# Patient Record
Sex: Male | Born: 1980
Health system: Southern US, Community
[De-identification: ages and names within clinical notes are randomized; demographics above are authoritative.]

## PROBLEM LIST (undated history)

## (undated) DIAGNOSIS — I10 Essential (primary) hypertension: Secondary | ICD-10-CM

## (undated) DIAGNOSIS — N200 Calculus of kidney: Secondary | ICD-10-CM

## (undated) HISTORY — PX: ARTERY REPAIR: SHX559

## (undated) HISTORY — PX: ADENOIDECTOMY: SUR15

---

## 1998-11-28 ENCOUNTER — Emergency Department (HOSPITAL_COMMUNITY): Admission: EM | Admit: 1998-11-28 | Discharge: 1998-11-28 | Payer: Self-pay | Admitting: Emergency Medicine

## 1999-09-10 ENCOUNTER — Emergency Department (HOSPITAL_COMMUNITY): Admission: EM | Admit: 1999-09-10 | Discharge: 1999-09-10 | Payer: Self-pay | Admitting: Emergency Medicine

## 2000-02-12 ENCOUNTER — Emergency Department (HOSPITAL_COMMUNITY): Admission: EM | Admit: 2000-02-12 | Discharge: 2000-02-12 | Payer: Self-pay | Admitting: *Deleted

## 2001-05-03 ENCOUNTER — Emergency Department (HOSPITAL_COMMUNITY): Admission: EM | Admit: 2001-05-03 | Discharge: 2001-05-03 | Payer: Self-pay | Admitting: Emergency Medicine

## 2001-05-07 ENCOUNTER — Encounter: Payer: Self-pay | Admitting: Emergency Medicine

## 2001-05-07 ENCOUNTER — Emergency Department (HOSPITAL_COMMUNITY): Admission: EM | Admit: 2001-05-07 | Discharge: 2001-05-07 | Payer: Self-pay | Admitting: Emergency Medicine

## 2001-06-07 ENCOUNTER — Emergency Department (HOSPITAL_COMMUNITY): Admission: EM | Admit: 2001-06-07 | Discharge: 2001-06-07 | Payer: Self-pay | Admitting: Emergency Medicine

## 2001-07-20 ENCOUNTER — Emergency Department (HOSPITAL_COMMUNITY): Admission: EM | Admit: 2001-07-20 | Discharge: 2001-07-20 | Payer: Self-pay | Admitting: Emergency Medicine

## 2002-08-13 ENCOUNTER — Emergency Department (HOSPITAL_COMMUNITY): Admission: EM | Admit: 2002-08-13 | Discharge: 2002-08-14 | Payer: Self-pay | Admitting: Emergency Medicine

## 2002-08-14 ENCOUNTER — Encounter: Payer: Self-pay | Admitting: Emergency Medicine

## 2002-09-02 ENCOUNTER — Emergency Department (HOSPITAL_COMMUNITY): Admission: EM | Admit: 2002-09-02 | Discharge: 2002-09-02 | Payer: Self-pay | Admitting: Emergency Medicine

## 2002-09-02 ENCOUNTER — Encounter: Payer: Self-pay | Admitting: Emergency Medicine

## 2002-10-17 ENCOUNTER — Emergency Department (HOSPITAL_COMMUNITY): Admission: EM | Admit: 2002-10-17 | Discharge: 2002-10-17 | Payer: Self-pay | Admitting: Emergency Medicine

## 2003-06-16 ENCOUNTER — Emergency Department (HOSPITAL_COMMUNITY): Admission: EM | Admit: 2003-06-16 | Discharge: 2003-06-16 | Payer: Self-pay | Admitting: Emergency Medicine

## 2003-08-07 ENCOUNTER — Emergency Department (HOSPITAL_COMMUNITY): Admission: EM | Admit: 2003-08-07 | Discharge: 2003-08-07 | Payer: Self-pay | Admitting: Emergency Medicine

## 2003-12-11 ENCOUNTER — Emergency Department (HOSPITAL_COMMUNITY): Admission: EM | Admit: 2003-12-11 | Discharge: 2003-12-11 | Payer: Self-pay | Admitting: Emergency Medicine

## 2003-12-18 ENCOUNTER — Emergency Department (HOSPITAL_COMMUNITY): Admission: EM | Admit: 2003-12-18 | Discharge: 2003-12-18 | Payer: Self-pay | Admitting: Emergency Medicine

## 2003-12-21 ENCOUNTER — Emergency Department (HOSPITAL_COMMUNITY): Admission: EM | Admit: 2003-12-21 | Discharge: 2003-12-22 | Payer: Self-pay | Admitting: Emergency Medicine

## 2004-04-29 ENCOUNTER — Emergency Department (HOSPITAL_COMMUNITY): Admission: EM | Admit: 2004-04-29 | Discharge: 2004-04-29 | Payer: Self-pay | Admitting: Emergency Medicine

## 2004-08-23 ENCOUNTER — Emergency Department (HOSPITAL_COMMUNITY): Admission: EM | Admit: 2004-08-23 | Discharge: 2004-08-23 | Payer: Self-pay | Admitting: Emergency Medicine

## 2005-06-23 ENCOUNTER — Emergency Department (HOSPITAL_COMMUNITY): Admission: EM | Admit: 2005-06-23 | Discharge: 2005-06-23 | Payer: Self-pay | Admitting: Emergency Medicine

## 2006-07-03 ENCOUNTER — Emergency Department (HOSPITAL_COMMUNITY): Admission: EM | Admit: 2006-07-03 | Discharge: 2006-07-03 | Payer: Self-pay | Admitting: *Deleted

## 2007-05-17 ENCOUNTER — Emergency Department (HOSPITAL_COMMUNITY): Admission: EM | Admit: 2007-05-17 | Discharge: 2007-05-17 | Payer: Self-pay | Admitting: Emergency Medicine

## 2007-05-18 ENCOUNTER — Emergency Department (HOSPITAL_COMMUNITY): Admission: EM | Admit: 2007-05-18 | Discharge: 2007-05-18 | Payer: Self-pay | Admitting: Emergency Medicine

## 2008-03-22 ENCOUNTER — Emergency Department (HOSPITAL_COMMUNITY): Admission: EM | Admit: 2008-03-22 | Discharge: 2008-03-22 | Payer: Self-pay | Admitting: *Deleted

## 2008-09-04 ENCOUNTER — Emergency Department (HOSPITAL_COMMUNITY): Admission: EM | Admit: 2008-09-04 | Discharge: 2008-09-04 | Payer: Self-pay | Admitting: Emergency Medicine

## 2008-09-07 ENCOUNTER — Emergency Department (HOSPITAL_COMMUNITY): Admission: EM | Admit: 2008-09-07 | Discharge: 2008-09-07 | Payer: Self-pay | Admitting: Emergency Medicine

## 2009-04-12 ENCOUNTER — Emergency Department (HOSPITAL_COMMUNITY): Admission: EM | Admit: 2009-04-12 | Discharge: 2009-04-12 | Payer: Self-pay | Admitting: Emergency Medicine

## 2009-09-10 ENCOUNTER — Emergency Department (HOSPITAL_COMMUNITY): Admission: EM | Admit: 2009-09-10 | Discharge: 2009-09-11 | Payer: Self-pay | Admitting: Emergency Medicine

## 2009-09-21 ENCOUNTER — Emergency Department (HOSPITAL_COMMUNITY): Admission: EM | Admit: 2009-09-21 | Discharge: 2009-09-21 | Payer: Self-pay | Admitting: Emergency Medicine

## 2010-03-27 ENCOUNTER — Emergency Department (HOSPITAL_COMMUNITY): Admission: EM | Admit: 2010-03-27 | Discharge: 2009-07-28 | Payer: Self-pay | Admitting: Emergency Medicine

## 2010-07-21 LAB — COMPREHENSIVE METABOLIC PANEL
BUN: 8 mg/dL (ref 6–23)
CO2: 28 mEq/L (ref 19–32)
Calcium: 9.2 mg/dL (ref 8.4–10.5)
Creatinine, Ser: 1.11 mg/dL (ref 0.4–1.5)
GFR calc Af Amer: >60 mL/min (ref >60)
GFR calc non Af Amer: >60 mL/min (ref >60)
Glucose, Bld: 106 mg/dL — ABNORMAL HIGH (ref 70–99)

## 2010-07-21 LAB — CBC
Hemoglobin: 13.8 g/dL (ref 13.0–17.0)
MCHC: 33.9 g/dL (ref 30.0–36.0)
MCV: 85.5 fL (ref 78.0–100.0)
RBC: 4.76 MIL/uL (ref 4.22–5.81)

## 2010-07-21 LAB — LIPASE, BLOOD: Lipase: 15 U/L (ref 11–59)

## 2010-07-21 LAB — POCT CARDIAC MARKERS
CKMB, poc: 1 ng/mL (ref 1.0–8.0)
Troponin i, poc: <0.05 ng/mL (ref 0.00–0.09)

## 2010-07-21 LAB — DIFFERENTIAL
Lymphocytes Relative: 14 % (ref 12–46)
Lymphs Abs: 0.9 10*3/uL (ref 0.7–4.0)
Neutro Abs: 4.7 10*3/uL (ref 1.7–7.7)
Neutrophils Relative %: 75 % (ref 43–77)

## 2010-09-18 ENCOUNTER — Encounter (HOSPITAL_COMMUNITY): Payer: Self-pay

## 2010-09-18 ENCOUNTER — Emergency Department (HOSPITAL_COMMUNITY)
Admission: EM | Admit: 2010-09-18 | Discharge: 2010-09-18 | Disposition: A | Discharge status: Home / Self Care | Payer: Self-pay | Attending: Emergency Medicine | Admitting: Emergency Medicine

## 2010-09-18 ENCOUNTER — Emergency Department (HOSPITAL_COMMUNITY): Payer: Self-pay

## 2010-09-18 DIAGNOSIS — M549 Dorsalgia, unspecified: Secondary | ICD-10-CM | POA: Insufficient documentation

## 2010-09-18 DIAGNOSIS — N200 Calculus of kidney: Secondary | ICD-10-CM | POA: Insufficient documentation

## 2010-09-18 DIAGNOSIS — R109 Unspecified abdominal pain: Secondary | ICD-10-CM | POA: Insufficient documentation

## 2010-09-18 LAB — URINALYSIS, ROUTINE W REFLEX MICROSCOPIC
Bilirubin Urine: NEGATIVE
Ketones, ur: NEGATIVE mg/dL
Nitrite: NEGATIVE
Urobilinogen, UA: 0.2 mg/dL (ref 0.0–1.0)

## 2010-09-20 ENCOUNTER — Inpatient Hospital Stay (INDEPENDENT_AMBULATORY_CARE_PROVIDER_SITE_OTHER)
Admission: RE | Admit: 2010-09-20 | Discharge: 2010-09-20 | Disposition: A | Discharge status: Home / Self Care | Payer: Self-pay | Source: Ambulatory Visit | Attending: Family Medicine | Admitting: Family Medicine

## 2010-09-20 DIAGNOSIS — R300 Dysuria: Secondary | ICD-10-CM

## 2010-09-20 DIAGNOSIS — R10819 Abdominal tenderness, unspecified site: Secondary | ICD-10-CM

## 2010-09-20 DIAGNOSIS — R319 Hematuria, unspecified: Secondary | ICD-10-CM

## 2010-09-20 LAB — POCT I-STAT, CHEM 8
BUN: 11 mg/dL (ref 6–23)
Creatinine, Ser: 1.3 mg/dL (ref 0.4–1.5)
Glucose, Bld: 99 mg/dL (ref 70–99)
Hemoglobin: 15.3 g/dL (ref 13.0–17.0)
Potassium: 3.7 mEq/L (ref 3.5–5.1)

## 2010-09-20 LAB — CBC
HCT: 40.8 % (ref 39.0–52.0)
Hemoglobin: 14 g/dL (ref 13.0–17.0)
MCH: 28.1 pg (ref 26.0–34.0)
MCHC: 34.3 g/dL (ref 30.0–36.0)
MCV: 81.9 fL (ref 78.0–100.0)
RBC: 4.98 MIL/uL (ref 4.22–5.81)

## 2010-09-20 LAB — POCT URINALYSIS DIP (DEVICE)
Glucose, UA: NEGATIVE mg/dL
Ketones, ur: NEGATIVE mg/dL
Protein, ur: NEGATIVE mg/dL
Specific Gravity, Urine: 1.015 (ref 1.005–1.030)
Urobilinogen, UA: 0.2 mg/dL (ref 0.0–1.0)

## 2010-09-22 LAB — URINE CULTURE
Colony Count: NO GROWTH
Culture: NO GROWTH

## 2011-05-29 ENCOUNTER — Encounter (HOSPITAL_COMMUNITY): Payer: Self-pay | Admitting: *Deleted

## 2011-05-29 ENCOUNTER — Emergency Department (HOSPITAL_COMMUNITY)
Admission: EM | Admit: 2011-05-29 | Discharge: 2011-05-29 | Disposition: A | Discharge status: Home / Self Care | Payer: Self-pay | Attending: Emergency Medicine | Admitting: Emergency Medicine

## 2011-05-29 DIAGNOSIS — F17200 Nicotine dependence, unspecified, uncomplicated: Secondary | ICD-10-CM | POA: Insufficient documentation

## 2011-05-29 DIAGNOSIS — M545 Low back pain, unspecified: Secondary | ICD-10-CM | POA: Insufficient documentation

## 2011-05-29 DIAGNOSIS — R109 Unspecified abdominal pain: Secondary | ICD-10-CM | POA: Insufficient documentation

## 2011-05-29 DIAGNOSIS — I10 Essential (primary) hypertension: Secondary | ICD-10-CM | POA: Insufficient documentation

## 2011-05-29 DIAGNOSIS — J45909 Unspecified asthma, uncomplicated: Secondary | ICD-10-CM | POA: Insufficient documentation

## 2011-05-29 HISTORY — DX: Essential (primary) hypertension: I10

## 2011-05-29 LAB — URINALYSIS, ROUTINE W REFLEX MICROSCOPIC
Leukocytes, UA: NEGATIVE
Nitrite: NEGATIVE
Protein, ur: NEGATIVE mg/dL
Specific Gravity, Urine: 1.016 (ref 1.005–1.030)
Urobilinogen, UA: 0.2 mg/dL (ref 0.0–1.0)

## 2011-05-29 MED ORDER — IBUPROFEN 800 MG PO TABS
800.0000 mg | ORAL_TABLET | Freq: Once | ORAL | Status: AC
  Administered 2011-05-29: 800 mg via ORAL
  Filled 2011-05-29 (×2): qty 1

## 2011-05-29 MED ORDER — CYCLOBENZAPRINE HCL 5 MG PO TABS: 5.0000 mg | ORAL_TABLET | Freq: Three times a day (TID) | ORAL | Status: AC | PRN

## 2011-05-29 MED ORDER — IBUPROFEN 600 MG PO TABS: 600.0000 mg | ORAL_TABLET | Freq: Four times a day (QID) | ORAL | Status: AC | PRN

## 2011-05-29 MED ORDER — OXYCODONE-ACETAMINOPHEN 5-325 MG PO TABS: 1.0000 | ORAL_TABLET | Freq: Four times a day (QID) | ORAL | Status: AC | PRN

## 2011-05-29 NOTE — ED Notes (Signed)
Pt states "seen here about 4 months ago, having back pain on both sides, they told me I was having kidney stones, I was peeing blood the last time, I's had h/a's more often, somewhat pain when I pee"

## 2011-05-29 NOTE — ED Provider Notes (Signed)
History     CSN: [id number]  Arrival date & time 05/29/11  1211   First MD Initiated Contact with Patient 05/29/11 1249      Chief Complaint  Patient presents with  . Flank Pain    (Consider location/radiation/quality/duration/timing/severity/associated sxs/prior treatment) HPI Patient presents with complaint of low back pain which is worse on the left. States pain has been ongoing for the past several weeks but was worse this morning when he woke up. He also notes that he has started a new job recently and has had some changes in his activities. The pain is worse with movement and palpation. He has not seen any blood in his urine or had any dysuria urinary urgency or frequency. He denies fevers or chills. He has no nausea or vomiting. He has had no specific trauma. He states that he was worked up in the past due to concern for kidney stones but in fact he did not have kidney stones. There no other alleviating or modifying factors. There no other associated systemic symptoms. He has no weakness in legs, no retention of urine, no incontinence of bowel or bladder. Symptoms are constant.  Past Medical History  Diagnosis Date  . Hypertension   . Asthma     History reviewed. No pertinent past surgical history.  No family history on file.  History  Substance Use Topics  . Smoking status: Current Everyday Smoker -- 0.5 packs/day  . Smokeless tobacco: Not on file  . Alcohol Use: Yes     rarely      Review of Systems ROS reviewed and otherwise negative except for mentioned in HPI  Allergies  Review of patient's allergies indicates no known allergies.  Home Medications   Current Outpatient Rx  Name Route Sig Dispense Refill  . ALBUTEROL SULFATE HFA 108 (90 BASE) MCG/ACT IN AERS Inhalation Inhale 2 puffs into the lungs every 6 (six) hours as needed. For shortness of breath.    . ALBUTEROL SULFATE (2.5 MG/3ML) 0.083% IN NEBU Nebulization Take 2.5 mg by nebulization every 6 (six)  hours as needed. For shortness of breath.    . CLONIDINE HCL PO Oral Take 1 tablet by mouth daily as needed. For chest pain.    . IBUPROFEN 200 MG PO TABS Oral Take 400 mg by mouth every 8 (eight) hours as needed. For pain.      BP 142/93  Pulse 76  Temp(Src) 99.3 F (37.4 C) (Oral)  Resp 16  Ht 6' 0.5" (1.842 m)  Wt 285 lb (129.275 kg)  BMI 38.12 kg/m2  SpO2 99% Vitals reviewed Physical Exam Physical Examination: General appearance - alert, well appearing, and in no distress Mental status - alert, oriented to person, place, and time Mouth - mucous membranes moist, pharynx normal without lesions Chest - clear to auscultation, no wheezes, rales or rhonchi, symmetric air entry Heart - normal rate, regular rhythm, normal S1, S2, no murmurs, rubs, clicks or gallops Abdomen - soft, nontender, nondistended, no masses or organomegaly Neurological - alert, oriented, normal speech, strength 5/5 in extremities x 4, sensation intact Back- mild parapinal tenderness to palpation- worse on left than right, no midline ttp, no CVA tenderness Musculoskeletal - no joint tenderness, deformity or swelling Extremities - peripheral pulses normal, no pedal edema, no clubbing or cyanosis Skin - normal coloration and turgor, no rashes  ED Course  Procedures (including critical care time)   Labs Reviewed  URINALYSIS, ROUTINE W REFLEX MICROSCOPIC   No results found.  No diagnosis found.    MDM  Patient presenting with low back pain primarily in the left side. His urine does not reveal signs of infection or hematuria. His exam reveals mild paraspinal muscle tenderness on the left. There is no CVA tenderness. Prior CT scan was reviewed and did not show any sign of renal stone but did show some bladder wall thickening which patient is aware had been found and was advised again to have followed up if need be. Patient was given ibuprofen for discomfort. I suspect muscle spasm/musculoskeletal low back  pain. There no signs or symptoms of cauda equina. He was discharged with strict return precautions and is agreeable with this plan.        Ethelda Chick, MD 06/02/11 [phone]

## 2011-05-29 NOTE — ED Notes (Signed)
Pt c/o bilateral lower back pain x 2 weeks, flank pain x 2 months.Today lower back pain worse described as burning, pt reports discomfort when urinating, urgency and the feeling of not emptying completely.

## 2012-02-16 ENCOUNTER — Emergency Department (HOSPITAL_COMMUNITY)
Admission: EM | Admit: 2012-02-16 | Discharge: 2012-02-16 | Disposition: A | Discharge status: Home / Self Care | Payer: Self-pay | Attending: Emergency Medicine | Admitting: Emergency Medicine

## 2012-02-16 ENCOUNTER — Encounter (HOSPITAL_COMMUNITY): Payer: Self-pay | Admitting: *Deleted

## 2012-02-16 DIAGNOSIS — I10 Essential (primary) hypertension: Secondary | ICD-10-CM | POA: Insufficient documentation

## 2012-02-16 DIAGNOSIS — F17200 Nicotine dependence, unspecified, uncomplicated: Secondary | ICD-10-CM | POA: Insufficient documentation

## 2012-02-16 DIAGNOSIS — J45909 Unspecified asthma, uncomplicated: Secondary | ICD-10-CM | POA: Insufficient documentation

## 2012-02-16 DIAGNOSIS — Z79899 Other long term (current) drug therapy: Secondary | ICD-10-CM | POA: Insufficient documentation

## 2012-02-16 DIAGNOSIS — R110 Nausea: Secondary | ICD-10-CM | POA: Insufficient documentation

## 2012-02-16 DIAGNOSIS — M940 Chondrocostal junction syndrome [Tietze]: Secondary | ICD-10-CM | POA: Insufficient documentation

## 2012-02-16 MED ORDER — HYDROCHLOROTHIAZIDE 25 MG PO TABS: 50.0000 mg | ORAL_TABLET | Freq: Every day | ORAL | Status: DC

## 2012-02-16 MED ORDER — HYDROCODONE-ACETAMINOPHEN 5-500 MG PO TABS: 1.0000 | ORAL_TABLET | Freq: Four times a day (QID) | ORAL | Status: DC | PRN

## 2012-02-16 NOTE — Progress Notes (Signed)
While pt in [facility] seen by [facility] community liaison spoke with her Pt offered [facility] services to assist with finding a [place] self pay provider and given health reform information  

## 2012-02-16 NOTE — ED Provider Notes (Signed)
History   This chart was scribed for non-physician practitioner working with Lyanne Co, MD by Greer Ee. This patient was seen in room WTR5/WTR5 and the patient's care was started at 15:31.    CSN: [id number]  Arrival date & time 02/16/12  1325   First MD Initiated Contact with Patient 02/16/12 1453      No chief complaint on file.   (Consider location/radiation/quality/duration/timing/severity/associated sxs/prior treatment) The history is provided by the patient. No language interpreter was used.   Jared Turner is a 31 y.o. male who presents to the Emergency Department complaining of 3 days of constant throbbing upper back pain with gradual onset at work not associated with heavy lifting or any unusual actions or movements.  Pain radiates to chest with deep breathing, but there is no true chest pain present.  Pain is improved at rest and temporarily by Aleve.  Pain worsened by twisting, bending down, and deep breathing.  Pt reports associated dyspnea and nausea.  Pt denies weakness, numbness, tingling, and emesis as associated.  Pt reports car trip to [place] 3 weeks ago but denies further long trips.  Pt has h/o HTN and asthma.  Pt is a current everyday smoker and reports rare alcohol use.   Past Medical History  Diagnosis Date  . Hypertension   . Asthma     History reviewed. No pertinent past surgical history.  History reviewed. No pertinent family history.  History  Substance Use Topics  . Smoking status: Current Every Day Smoker -- 0.5 packs/day  . Smokeless tobacco: Never Used  . Alcohol Use: Yes     rarely      Review of Systems  HENT: Negative for neck pain.   Gastrointestinal: Positive for nausea. Negative for vomiting and abdominal pain.    Allergies  Review of patient's allergies indicates no known allergies.  Home Medications   Current Outpatient Rx  Name Route Sig Dispense Refill  . ALBUTEROL SULFATE HFA 108 (90 BASE) MCG/ACT IN AERS  Inhalation Inhale 2 puffs into the lungs every 6 (six) hours as needed. For shortness of breath.    . CYCLOBENZAPRINE HCL 5 MG PO TABS Oral Take 5 mg by mouth 3 (three) times daily as needed. Muscle spasms    . HYDROCHLOROTHIAZIDE 50 MG PO TABS Oral Take 50 mg by mouth daily.      BP 155/95  Pulse 71  Temp 98.7 F (37.1 C) (Oral)  Resp 18  SpO2 99%  Physical Exam  Nursing note and vitals reviewed. Constitutional: He is oriented to person, place, and time. He appears well-developed and well-nourished. No distress.  HENT:  Head: Normocephalic and atraumatic.  Eyes: EOM are normal.  Neck: Neck supple. No tracheal deviation present.  Cardiovascular: Normal rate, regular rhythm and normal heart sounds.   No murmur heard. Pulmonary/Chest: Effort normal and breath sounds normal. No respiratory distress.  Musculoskeletal: Normal range of motion.       Good ROM of right shoulder with no pain.  Neurological: He is alert and oriented to person, place, and time.       5/5 grip strength.  5/5 upper extremity strength.  Skin: Skin is warm and dry.       No rash over back.  Psychiatric: He has a normal mood and affect. His behavior is normal.    ED Course  Procedures (including critical care time) DIAGNOSTIC STUDIES: Oxygen Saturation is 99% on room air, normal by my interpretation.    COORDINATION OF  CARE: 15:38- Patient informed of clinical course, understands medical decision-making process, and agrees with plan.    Labs Reviewed - No data to display No results found.   1. Costochondritis       MDM  Exam consistent with costochondritis. No focal neuro deficits. Full shoulder ROM. Advised ibuprofen/tylenol/aleve. Rx vicodin for break thru pain for a couple days. Return precautions discussed.  I personally performed the services described in this documentation, which was scribed in my presence. The recorded information has been reviewed and considered. Lyanne Co,  MD         Trevor Mace, PA-C 02/16/12 [phone]

## 2012-02-16 NOTE — ED Notes (Signed)
Pt states saturday was at home and stood up and started having severe R shoulder/back pain, pt states he almost hit the ground, over the past couple days has worsened, now the pain is across upper back area, pt denies any injury.

## 2012-02-17 NOTE — ED Provider Notes (Addendum)
I saw and evaluated the patient, reviewed the resident's note and I agree with the findings and plan.   Lyanne Co, MD 02/17/12 0005  Lyanne Co, MD 02/17/12 [phone]

## 2013-02-26 ENCOUNTER — Encounter (HOSPITAL_COMMUNITY): Payer: Self-pay | Admitting: Emergency Medicine

## 2013-02-26 ENCOUNTER — Emergency Department (HOSPITAL_COMMUNITY)
Admission: EM | Admit: 2013-02-26 | Discharge: 2013-02-26 | Disposition: A | Discharge status: Home / Self Care | Payer: Self-pay | Attending: Emergency Medicine | Admitting: Emergency Medicine

## 2013-02-26 DIAGNOSIS — K089 Disorder of teeth and supporting structures, unspecified: Secondary | ICD-10-CM | POA: Insufficient documentation

## 2013-02-26 DIAGNOSIS — H9209 Otalgia, unspecified ear: Secondary | ICD-10-CM | POA: Insufficient documentation

## 2013-02-26 DIAGNOSIS — G8929 Other chronic pain: Secondary | ICD-10-CM | POA: Insufficient documentation

## 2013-02-26 DIAGNOSIS — J45901 Unspecified asthma with (acute) exacerbation: Secondary | ICD-10-CM | POA: Insufficient documentation

## 2013-02-26 DIAGNOSIS — R079 Chest pain, unspecified: Secondary | ICD-10-CM | POA: Insufficient documentation

## 2013-02-26 DIAGNOSIS — F17200 Nicotine dependence, unspecified, uncomplicated: Secondary | ICD-10-CM | POA: Insufficient documentation

## 2013-02-26 DIAGNOSIS — I10 Essential (primary) hypertension: Secondary | ICD-10-CM | POA: Insufficient documentation

## 2013-02-26 DIAGNOSIS — J040 Acute laryngitis: Secondary | ICD-10-CM | POA: Insufficient documentation

## 2013-02-26 DIAGNOSIS — Z79899 Other long term (current) drug therapy: Secondary | ICD-10-CM | POA: Insufficient documentation

## 2013-02-26 LAB — RAPID STREP SCREEN (MED CTR MEBANE ONLY): Streptococcus, Group A Screen (Direct): NEGATIVE

## 2013-02-26 MED ORDER — BENZONATATE 100 MG PO CAPS
100.0000 mg | ORAL_CAPSULE | Freq: Once | ORAL | Status: AC
  Administered 2013-02-26: 100 mg via ORAL
  Filled 2013-02-26: qty 1

## 2013-02-26 MED ORDER — AMOXICILLIN 500 MG PO CAPS: 500.0000 mg | ORAL_CAPSULE | Freq: Three times a day (TID) | ORAL | Status: DC

## 2013-02-26 MED ORDER — BENZONATATE 100 MG PO CAPS: 100.0000 mg | ORAL_CAPSULE | Freq: Three times a day (TID) | ORAL | Status: DC

## 2013-02-26 MED ORDER — HYDROCODONE-ACETAMINOPHEN 5-325 MG PO TABS: 1.0000 | ORAL_TABLET | ORAL | Status: DC | PRN

## 2013-02-26 NOTE — ED Notes (Signed)
Onset yesterday throat started tingling then started with cough, runny nose, nasal congestion, hoarse voice.

## 2013-02-26 NOTE — ED Notes (Signed)
Pt. reports sore throat with occasional productive cough onset yesterday evening .

## 2013-02-26 NOTE — ED Provider Notes (Signed)
CSN: [id number]     Arrival date & time 02/26/13  1957 History  This chart was scribed for non-physician practitioner, Fayrene Helper, PA-C,working with No att. providers found, by Karle Plumber, ED Scribe.  This patient was seen in room TR08C/TR08C and the patient's care was started at 8:39 PM.  Chief Complaint  Patient presents with  . Sore Throat   The history is provided by the patient. No language interpreter was used.   HPI Comments:  Jared Turner is a 32 y.o. male with h/o bronchitis who presents to the Emergency Department complaining of constant severe sore throat onset yesterday. Pt reports an associated intermittent productive cough, hoarseness, ear fullness, mild rhinorrhea and chest tightness. Pt states he has used his Albuterol inhaler with mild relief. He denies any recent travel. He denies fever, chills, nausea, vomiting, diarrhea, body aches, or abdominal pain. He states he smokes approximately 1/2 PPD.   Pt also report having recurrent abscess involving his L upper gum line which he can usually taste it and sometimes have to puncture it to drain pus.  He request for a dental referral.  Sts sxs has been ongoing for more than a year.   Past Medical History  Diagnosis Date  . Hypertension   . Asthma    History reviewed. No pertinent past surgical history. No family history on file. History  Substance Use Topics  . Smoking status: Current Every Day Smoker -- 0.50 packs/day  . Smokeless tobacco: Never Used  . Alcohol Use: Yes     Comment: rarely    Review of Systems  Constitutional: Negative for fever and chills.  HENT: Positive for ear pain (secondary to sore throat), rhinorrhea and sore throat. Negative for sneezing.   Respiratory: Positive for cough and shortness of breath.   Cardiovascular: Positive for chest pain (secondary to cough).  Gastrointestinal: Negative for nausea, vomiting, abdominal pain and diarrhea.    Allergies  Review of patient's allergies  indicates no known allergies.  Home Medications   Current Outpatient Rx  Name  Route  Sig  Dispense  Refill  . albuterol (PROVENTIL HFA;VENTOLIN HFA) 108 (90 BASE) MCG/ACT inhaler   Inhalation   Inhale 2 puffs into the lungs every 6 (six) hours as needed. For shortness of breath.         . cyclobenzaprine (FLEXERIL) 5 MG tablet   Oral   Take 5 mg by mouth 3 (three) times daily as needed. Muscle spasms         . hydrochlorothiazide (HYDRODIURIL) 25 MG tablet   Oral   Take 2 tablets (50 mg total) by mouth daily.   30 tablet   0   . hydrochlorothiazide (HYDRODIURIL) 50 MG tablet   Oral   Take 50 mg by mouth daily.         Marland Kitchen HYDROcodone-acetaminophen (VICODIN) 5-500 MG per tablet   Oral   Take 1-2 tablets by mouth every 6 (six) hours as needed for pain.   8 tablet   0    Triage Vitals: BP 165/108  Pulse 93  Temp(Src) 99.3 F (37.4 C) (Oral)  Resp 18  Wt 306 lb 7 oz (138.999 kg)  SpO2 97% Physical Exam  Nursing note and vitals reviewed. Constitutional: He is oriented to person, place, and time. He appears well-developed and well-nourished. No distress.  HENT:  Head: Normocephalic and atraumatic.  Right Ear: External ear normal.  Left Ear: External ear normal.  Mouth/Throat: Oropharynx is clear and moist. No oropharyngeal  exudate.  Mild rhinorrhea. No tonsillar enlargement or exudate. Uvula midline. Throat non-erythematous.   Mouth: L upper gumline tender to palpation, no obvious abscess noted, mild dental decay to 1st premolar.    Eyes: Conjunctivae are normal. No scleral icterus.  Neck: Neck supple.  Cardiovascular: Normal rate and intact distal pulses.   Pulmonary/Chest: Effort normal. No stridor. No respiratory distress.  Abdominal: Soft. Normal appearance. He exhibits no distension and no mass. There is no tenderness. There is no rebound and no guarding.  No evidence of splenomegaly.  Lymphadenopathy:    He has no cervical adenopathy.  Neurological: He is  alert and oriented to person, place, and time.  Skin: Skin is warm and dry. No rash noted.  Psychiatric: He has a normal mood and affect. His behavior is normal.    ED Course  Procedures (including critical care time) DIAGNOSTIC STUDIES: Oxygen Saturation is 97% on RA, normal by my interpretation.   COORDINATION OF CARE: 8:45 PM- Will obtain a rapid strep test. Pt verbalizes understanding and agrees to plan.  9:27 PM Strep test negative.  Since pt has recurrent dental abscess, will d/c with abx, referral to dentist, and will also treats laryngitis sxs.  ENT referral as needed.    Medications  benzonatate (TESSALON) capsule 100 mg (not administered)   Labs Review Labs Reviewed  RAPID STREP SCREEN   Imaging Review No results found.  EKG Interpretation   None       MDM   1. Chronic dental pain   2. Laryngitis    BP 163/109  Pulse 76  Temp(Src) 97.4 F (36.3 C) (Oral)  Resp 18  Wt 306 lb 7 oz (138.999 kg)  SpO2 97%   DIAGNOSTIC STUDIES: Oxygen Saturation is 97% on room air, normal by my interpretation.   COORDINATION OF CARE:  I personally performed the services described in this documentation, which was scribed in my presence. The recorded information has been reviewed and is accurate.    Fayrene Helper, PA-C 03/01/13 1502

## 2013-02-28 LAB — CULTURE, GROUP A STREP

## 2013-03-01 NOTE — ED Provider Notes (Signed)
Medical screening examination/treatment/procedure(s) were performed by non-physician practitioner and as supervising physician I was immediately available for consultation/collaboration.     Jazmine Longshore, MD 03/01/13 1556 

## 2015-05-19 ENCOUNTER — Encounter (HOSPITAL_COMMUNITY): Payer: Self-pay | Admitting: Vascular Surgery

## 2015-05-19 ENCOUNTER — Emergency Department (HOSPITAL_COMMUNITY)
Admission: EM | Admit: 2015-05-19 | Discharge: 2015-05-19 | Disposition: A | Discharge status: Home / Self Care | Payer: Self-pay | Attending: Emergency Medicine | Admitting: Emergency Medicine

## 2015-05-19 DIAGNOSIS — Z79899 Other long term (current) drug therapy: Secondary | ICD-10-CM | POA: Insufficient documentation

## 2015-05-19 DIAGNOSIS — Z87442 Personal history of urinary calculi: Secondary | ICD-10-CM | POA: Insufficient documentation

## 2015-05-19 DIAGNOSIS — Z792 Long term (current) use of antibiotics: Secondary | ICD-10-CM | POA: Insufficient documentation

## 2015-05-19 DIAGNOSIS — I10 Essential (primary) hypertension: Secondary | ICD-10-CM | POA: Insufficient documentation

## 2015-05-19 DIAGNOSIS — J45909 Unspecified asthma, uncomplicated: Secondary | ICD-10-CM | POA: Insufficient documentation

## 2015-05-19 DIAGNOSIS — H6691 Otitis media, unspecified, right ear: Secondary | ICD-10-CM | POA: Insufficient documentation

## 2015-05-19 DIAGNOSIS — F17200 Nicotine dependence, unspecified, uncomplicated: Secondary | ICD-10-CM | POA: Insufficient documentation

## 2015-05-19 DIAGNOSIS — Z7982 Long term (current) use of aspirin: Secondary | ICD-10-CM | POA: Insufficient documentation

## 2015-05-19 DIAGNOSIS — R590 Localized enlarged lymph nodes: Secondary | ICD-10-CM | POA: Insufficient documentation

## 2015-05-19 HISTORY — DX: Calculus of kidney: N20.0

## 2015-05-19 MED ORDER — AMOXICILLIN 500 MG PO CAPS: 500.0000 mg | ORAL_CAPSULE | Freq: Three times a day (TID) | ORAL | Status: DC

## 2015-05-19 MED ORDER — IBUPROFEN 600 MG PO TABS: 600.0000 mg | ORAL_TABLET | Freq: Four times a day (QID) | ORAL | Status: DC | PRN

## 2015-05-19 NOTE — ED Notes (Signed)
Pt reports to the ED for eval of right ear pain and drainage. Pt reports he has been  Having problems with his ear x 1 year. Pt reports he feels it draining down into his throat and occasionally it drains externally. Denies any fevers, chills, or N/V. Pt also reports he shaved his face and he is broken out on his face and it is itchy and painful x 2 months. Pt A&OX4, resp e/u, and skin warm and dry.

## 2015-05-19 NOTE — Discharge Instructions (Signed)
Ibuprofen as prescribed as needed for pain. Amoxil for infection until all gone. Follow up with primary care doctor for recheck. Return if worsening.    Otitis Media, Adult Otitis media is redness, soreness, and inflammation of the middle ear. Otitis media may be caused by allergies or, most commonly, by infection. Often it occurs as a complication of the common cold. SIGNS AND SYMPTOMS Symptoms of otitis media may include:  Earache.  Fever.  Ringing in your ear.  Headache.  Leakage of fluid from the ear. DIAGNOSIS To diagnose otitis media, your health care provider will examine your ear with an otoscope. This is an instrument that allows your health care provider to see into your ear in order to examine your eardrum. Your health care provider also will ask you questions about your symptoms. TREATMENT  Typically, otitis media resolves on its own within 3-5 days. Your health care provider may prescribe medicine to ease your symptoms of pain. If otitis media does not resolve within 5 days or is recurrent, your health care provider may prescribe antibiotic medicines if he or she suspects that a bacterial infection is the cause. HOME CARE INSTRUCTIONS   If you were prescribed an antibiotic medicine, finish it all even if you start to feel better.  Take medicines only as directed by your health care provider.  Keep all follow-up visits as directed by your health care provider. SEEK MEDICAL CARE IF:  You have otitis media only in one ear, or bleeding from your nose, or both.  You notice a lump on your neck.  You are not getting better in 3-5 days.  You feel worse instead of better. SEEK IMMEDIATE MEDICAL CARE IF:   You have pain that is not controlled with medicine.  You have swelling, redness, or pain around your ear or stiffness in your neck.  You notice that part of your face is paralyzed.  You notice that the bone behind your ear (mastoid) is tender when you touch it. MAKE  SURE YOU:   Understand these instructions.  Will watch your condition.  Will get help right away if you are not doing well or get worse.   This information is not intended to replace advice given to you by your health care provider. Make sure you discuss any questions you have with your health care provider.   Document Released: 01/10/2004 Document Revised: 04/27/2014 Document Reviewed: 11/01/2012 Elsevier Interactive Patient Education [employer]

## 2015-05-19 NOTE — ED Provider Notes (Signed)
CSN: [id number]     Arrival date & time 05/19/15  1637 History   First MD Initiated Contact with Patient 05/19/15 1652     Chief Complaint  Patient presents with  . Ear Drainage     (Consider location/radiation/quality/duration/timing/severity/associated sxs/prior Treatment) HPI  Jared Turner is a 35 y.o. male with hx of hypertension, presents to emergency department complaining of right ear pain. Patient states that the ear started hurting yesterday. He reports prior ear infections that have been much worse, and states "I just wanted to get to that point." He denies any changes in hearing. Denies any injuries. He denies any drainage from the ear. He also reports a swollen bump under his chin. He states it is tender to the touch. States also started yesterday. He denies any fever or chills. No other URI symptoms. Did not try to take anything for this pain. No other complaints.   Past Medical History  Diagnosis Date  . Hypertension   . Asthma   . Nephrolithiasis    Past Surgical History  Procedure Laterality Date  . Artery repair Right arm   No family history on file. Social History  Substance Use Topics  . Smoking status: Current Every Day Smoker -- 0.50 packs/day  . Smokeless tobacco: Never Used  . Alcohol Use: Yes     Comment: rarely    Review of Systems  Constitutional: Negative for fever and chills.  HENT: Positive for ear pain. Negative for congestion and sore throat.   Respiratory: Negative for cough, chest tightness and shortness of breath.   Cardiovascular: Negative for chest pain, palpitations and leg swelling.  Musculoskeletal: Negative for myalgias, arthralgias, neck pain and neck stiffness.  Skin: Negative for rash.  Allergic/Immunologic: Negative for immunocompromised state.  Neurological: Positive for headaches. Negative for dizziness, weakness, light-headedness and numbness.  All other systems reviewed and are negative.     Allergies  Milk-related  compounds  Home Medications   Prior to Admission medications   Medication Sig Start Date End Date Taking? Authorizing Provider  albuterol (PROVENTIL HFA;VENTOLIN HFA) 108 (90 BASE) MCG/ACT inhaler Inhale 2 puffs into the lungs 2 (two) times daily as needed for wheezing or shortness of breath. For shortness of breath.    Historical Provider, MD  amoxicillin (AMOXIL) 500 MG capsule Take 1 capsule (500 mg total) by mouth 3 (three) times daily. 02/26/13   Fayrene Helper, PA-C  Ascorbic Acid (VITAMIN C PO) Take 1 tablet by mouth daily.    Historical Provider, MD  aspirin-acetaminophen-caffeine (EXCEDRIN MIGRAINE) [phone] MG per tablet Take 1 tablet by mouth 2 (two) times daily as needed (pain).    Historical Provider, MD  b complex vitamins tablet Take 1 tablet by mouth daily.    Historical Provider, MD  benzonatate (TESSALON) 100 MG capsule Take 1 capsule (100 mg total) by mouth every 8 (eight) hours. 02/26/13   Fayrene Helper, PA-C  Cyanocobalamin (VITAMIN B-12 PO) Take 1 tablet by mouth daily.    Historical Provider, MD  hydrochlorothiazide (HYDRODIURIL) 50 MG tablet Take 50 mg by mouth daily.    Historical Provider, MD  HYDROcodone-acetaminophen (NORCO/VICODIN) 5-325 MG per tablet Take 1 tablet by mouth every 4 (four) hours as needed. 02/26/13   Fayrene Helper, PA-C   BP 163/97 mmHg  Pulse 81  Temp(Src) 97.9 F (36.6 C) (Oral)  Resp 18  SpO2 100% Physical Exam  Constitutional: He appears well-developed and well-nourished. No distress.  HENT:  Head: Normocephalic and atraumatic.  Normal right ear  and ear canal with no exudate or swelling. Right TM is erythematous and bulging. About 1 x 1 cm, round, firm nodule palpated in the submental space. No swelling under the tongue. No trismus.  Eyes: Conjunctivae are normal.  Neck: Neck supple.  Cardiovascular: Normal rate, regular rhythm and normal heart sounds.   Pulmonary/Chest: Effort normal. No respiratory distress. He has no wheezes. He has no rales.   Musculoskeletal: He exhibits no edema.  Neurological: He is alert.  Skin: Skin is warm and dry.  Nursing note and vitals reviewed.   ED Course  Procedures (including critical care time) Labs Review Labs Reviewed - No data to display  Imaging Review No results found. I have personally reviewed and evaluated these images and lab results as part of my medical decision-making.   EKG Interpretation None      MDM   Final diagnoses:  Recurrent acute otitis media of right ear, unspecified otitis media type  Enlarged submental lymph node   Patient's exam is consistent with right otitis media. Will start on amoxicillin. Patient also has a firm swelling to the submental space, most likely lymphadenopathy versus salivary gland swelling. Instructed to try sour candies, anti-inflammatories. Keep an eye on the swelling. If not improving after ear infection has resolved, make sure to follow-up for recheck. Return precautions discussed.  Filed Vitals:   05/19/15 1642  BP: 163/97  Pulse: 81  Temp: 97.9 F (36.6 C)  TempSrc: Oral  Resp: 18  SpO2: 100%       Jaynie Crumble, PA-C 05/19/15 2206  Gerhard Munch, MD 05/19/15 2327

## 2018-05-21 ENCOUNTER — Emergency Department (HOSPITAL_COMMUNITY)
Admission: EM | Admit: 2018-05-21 | Discharge: 2018-05-21 | Disposition: A | Discharge status: Home / Self Care | Payer: Self-pay | Attending: Emergency Medicine | Admitting: Emergency Medicine

## 2018-05-21 ENCOUNTER — Encounter (HOSPITAL_COMMUNITY): Payer: Self-pay | Admitting: Emergency Medicine

## 2018-05-21 ENCOUNTER — Emergency Department (HOSPITAL_COMMUNITY): Payer: Self-pay

## 2018-05-21 ENCOUNTER — Other Ambulatory Visit: Payer: Self-pay

## 2018-05-21 DIAGNOSIS — I10 Essential (primary) hypertension: Secondary | ICD-10-CM | POA: Insufficient documentation

## 2018-05-21 DIAGNOSIS — Z79899 Other long term (current) drug therapy: Secondary | ICD-10-CM | POA: Insufficient documentation

## 2018-05-21 DIAGNOSIS — N200 Calculus of kidney: Secondary | ICD-10-CM | POA: Insufficient documentation

## 2018-05-21 DIAGNOSIS — J45909 Unspecified asthma, uncomplicated: Secondary | ICD-10-CM | POA: Insufficient documentation

## 2018-05-21 DIAGNOSIS — F17200 Nicotine dependence, unspecified, uncomplicated: Secondary | ICD-10-CM | POA: Insufficient documentation

## 2018-05-21 LAB — COMPREHENSIVE METABOLIC PANEL
ALT: 25 U/L (ref 0–44)
AST: 25 U/L (ref 15–41)
Albumin: 5.1 g/dL — ABNORMAL HIGH (ref 3.5–5.0)
Alkaline Phosphatase: 72 U/L (ref 38–126)
Anion gap: 9 (ref 5–15)
BUN: 20 mg/dL (ref 6–20)
CALCIUM: 9.4 mg/dL (ref 8.9–10.3)
CO2: 27 mmol/L (ref 22–32)
CREATININE: 1.54 mg/dL — AB (ref 0.61–1.24)
Chloride: 102 mmol/L (ref 98–111)
GFR calc Af Amer: >60 mL/min (ref >60)
GFR calc non Af Amer: 56 mL/min — ABNORMAL LOW (ref >60)
Glucose, Bld: 126 mg/dL — ABNORMAL HIGH (ref 70–99)
Potassium: 3.7 mmol/L (ref 3.5–5.1)
Sodium: 138 mmol/L (ref 135–145)
Total Bilirubin: 0.6 mg/dL (ref 0.3–1.2)
Total Protein: 8.5 g/dL — ABNORMAL HIGH (ref 6.5–8.1)

## 2018-05-21 LAB — URINALYSIS, ROUTINE W REFLEX MICROSCOPIC
Bilirubin Urine: NEGATIVE
Glucose, UA: NEGATIVE mg/dL
HGB URINE DIPSTICK: NEGATIVE
KETONES UR: NEGATIVE mg/dL
Leukocytes, UA: NEGATIVE
Nitrite: NEGATIVE
PH: 7 (ref 5.0–8.0)
PROTEIN: NEGATIVE mg/dL
Specific Gravity, Urine: 1.011 (ref 1.005–1.030)

## 2018-05-21 LAB — CBC
HCT: 45.1 % (ref 39.0–52.0)
Hemoglobin: 14.6 g/dL (ref 13.0–17.0)
MCH: 28.4 pg (ref 26.0–34.0)
MCHC: 32.4 g/dL (ref 30.0–36.0)
MCV: 87.7 fL (ref 80.0–100.0)
Platelets: 289 10*3/uL (ref 150–400)
RBC: 5.14 MIL/uL (ref 4.22–5.81)
RDW: 14.5 % (ref 11.5–15.5)
WBC: 10.1 10*3/uL (ref 4.0–10.5)
nRBC: 0 % (ref 0.0–0.2)

## 2018-05-21 LAB — LIPASE, BLOOD: LIPASE: 37 U/L (ref 11–51)

## 2018-05-21 MED ORDER — ONDANSETRON HCL 4 MG PO TABS: 4.0000 mg | ORAL_TABLET | Freq: Three times a day (TID) | ORAL | 0 refills | Status: DC | PRN

## 2018-05-21 MED ORDER — MORPHINE SULFATE (PF) 4 MG/ML IV SOLN
4.0000 mg | Freq: Once | INTRAVENOUS | Status: AC
  Administered 2018-05-21: 4 mg via INTRAVENOUS
  Filled 2018-05-21: qty 1

## 2018-05-21 MED ORDER — SODIUM CHLORIDE 0.9 % IV BOLUS
1000.0000 mL | Freq: Once | INTRAVENOUS | Status: AC
  Administered 2018-05-21: 1000 mL via INTRAVENOUS

## 2018-05-21 MED ORDER — ONDANSETRON HCL 4 MG/2ML IJ SOLN
4.0000 mg | Freq: Once | INTRAMUSCULAR | Status: AC
  Administered 2018-05-21: 4 mg via INTRAVENOUS
  Filled 2018-05-21: qty 2

## 2018-05-21 MED ORDER — OXYCODONE-ACETAMINOPHEN 5-325 MG PO TABS: 1.0000 | ORAL_TABLET | ORAL | 0 refills | Status: AC | PRN

## 2018-05-21 MED ORDER — SODIUM CHLORIDE 0.9% FLUSH
3.0000 mL | Freq: Once | INTRAVENOUS | Status: AC
  Administered 2018-05-21: 3 mL via INTRAVENOUS

## 2018-05-21 MED ORDER — KETOROLAC TROMETHAMINE 30 MG/ML IJ SOLN
30.0000 mg | Freq: Once | INTRAMUSCULAR | Status: AC
  Administered 2018-05-21: 30 mg via INTRAVENOUS
  Filled 2018-05-21: qty 1

## 2018-05-21 MED ORDER — TAMSULOSIN HCL 0.4 MG PO CAPS: 0.4000 mg | ORAL_CAPSULE | Freq: Every day | ORAL | 0 refills | Status: DC

## 2018-05-21 NOTE — ED Notes (Signed)
Urinal given--encouraged pt to give urine sample.

## 2018-05-21 NOTE — Discharge Instructions (Signed)
Mr. Kyzer, Krotz were treated in the emergency department for a kidney stone. I have prescribed pain medication, nausea medication, and a medicine to help you urinate which should help the kidney stone pass.  I have given you contact information for a primary care doctor I'd like you to establish care with to follow up on your blood pressure. I suspect you may need some additional medications to treat this.  Please make an appointment within the next 1-2 weeks.

## 2018-05-21 NOTE — ED Triage Notes (Signed)
Patient c/o right flank pain radiating to abdomen with vomiting since this morning. Denies diarrhea. Hx kidney stones. Denies urinary sx.

## 2018-05-21 NOTE — ED Provider Notes (Signed)
[facility]-EMERGENCY DEPT Provider Note   CSN: [id number] Arrival date & time: 05/21/18  1323     History   Chief Complaint Chief Complaint  Patient presents with  . Flank Pain  . Abdominal Pain  . Hypertension    HPI Jared Turner is a 38 y.o. male presented with acute onset right flank pain radiating into this abdomen that began this morning. Pain is described as a constant deep ache and rated 10/10. Also complains diaphoresis and several episodes of non-bloody, non-bilious vomiting since symptom onset. He has a history of nephrolithiasis in the past, last one being almost 4 years ago. No fevers, chills, diarrhea, hematuria, dysuria, or sick contacts.     Past Medical History:  Diagnosis Date  . Asthma   . Hypertension   . Nephrolithiasis     There are no active problems to display for this patient.   Past Surgical History:  Procedure Laterality Date  . ARTERY REPAIR Right arm        Home Medications    Prior to Admission medications   Medication Sig Start Date End Date Taking? Authorizing Provider  albuterol (PROVENTIL HFA;VENTOLIN HFA) 108 (90 BASE) MCG/ACT inhaler Inhale 2 puffs into the lungs 2 (two) times daily as needed for wheezing or shortness of breath. For shortness of breath.    [provider]  amoxicillin (AMOXIL) 500 MG capsule Take 1 capsule (500 mg total) by mouth 3 (three) times daily. 02/26/13   Fayrene Helperran, [place], PA-C  amoxicillin (AMOXIL) 500 MG capsule Take 1 capsule (500 mg total) by mouth 3 (three) times daily. 05/19/15   Kirichenko, Lemont Fillersatyana, PA-C  Ascorbic Acid (VITAMIN C PO) Take 1 tablet by mouth daily.    [provider]  aspirin-acetaminophen-caffeine (EXCEDRIN MIGRAINE) [phone] MG per tablet Take 1 tablet by mouth 2 (two) times daily as needed (pain).    [provider]  b complex vitamins tablet Take 1 tablet by mouth daily.    [provider]  benzonatate (TESSALON) 100 MG  capsule Take 1 capsule (100 mg total) by mouth every 8 (eight) hours. 02/26/13   Fayrene Helperran, [place]ie, PA-C  Cyanocobalamin (VITAMIN B-12 PO) Take 1 tablet by mouth daily.    [provider]  hydrochlorothiazide (HYDRODIURIL) 50 MG tablet Take 50 mg by mouth daily.    [provider]  HYDROcodone-acetaminophen (NORCO/VICODIN) 5-325 MG per tablet Take 1 tablet by mouth every 4 (four) hours as needed. 02/26/13   Fayrene Helperran, [place], PA-C  ibuprofen (ADVIL,MOTRIN) 600 MG tablet Take 1 tablet (600 mg total) by mouth every 6 (six) hours as needed. 05/19/15   Kirichenko, Tatyana, PA-C  ondansetron (ZOFRAN) 4 MG tablet Take 1 tablet (4 mg total) by mouth every 8 (eight) hours as needed for nausea or vomiting. 05/21/18   Mikalyn Hermida D, DO  oxyCODONE-acetaminophen (PERCOCET) 5-325 MG tablet Take 1 tablet by mouth every 4 (four) hours as needed for up to 5 days for severe pain. 05/21/18 05/26/18  Lenward ChancellorBloomfield, Cleven Jansma D, DO  tamsulosin (FLOMAX) 0.4 MG CAPS capsule Take 1 capsule (0.4 mg total) by mouth daily after breakfast. 05/21/18   Lenward ChancellorBloomfield, Salsabeel Gorelick D, DO    Family History No family history on file.  Social History Social History   Tobacco Use  . Smoking status: Current Every Day Smoker    Packs/day: 0.50  . Smokeless tobacco: Never Used  Substance Use Topics  . Alcohol use: Yes    Comment: rarely  . Drug use: No  Frequency: 3.0 times per week     Allergies   Milk-related compounds and Tylenol [acetaminophen]   Review of Systems Review of Systems  All other systems reviewed and are negative.    Physical Exam Updated Vital Signs BP (!) 171/87 (BP Location: Right Arm)   Pulse 71   Temp 97.8 F (36.6 C) (Oral)   Resp 18   Ht 6' (1.829 m)   Wt 129.3 kg   SpO2 100%   BMI 38.65 kg/m   Physical Exam Constitutional:      General: He is in acute distress.     Appearance: He is diaphoretic.  Cardiovascular:     Rate and Rhythm: Normal rate and regular rhythm.  Pulmonary:      Effort: Pulmonary effort is normal.     Breath sounds: Normal breath sounds.  Abdominal:     General: Bowel sounds are normal.     Palpations: Abdomen is soft.     Tenderness: There is generalized abdominal tenderness.  Musculoskeletal:     Comments: Generalized right flank tenderness  Neurological:     General: No focal deficit present.     Mental Status: He is alert.      ED Treatments / Results  Labs (all labs ordered are listed, but only abnormal results are displayed) Labs Reviewed  COMPREHENSIVE METABOLIC PANEL - Abnormal; Notable for the following components:      Result Value   Glucose, Bld 126 (*)    Creatinine, Ser 1.54 (*)    Total Protein 8.5 (*)    Albumin 5.1 (*)    GFR calc non Af Amer 56 (*)    All other components within normal limits  LIPASE, BLOOD  CBC  URINALYSIS, ROUTINE W REFLEX MICROSCOPIC    EKG None  Radiology Ct Renal Stone Study  Result Date: 05/21/2018 CLINICAL DATA:  Acute right flank pain. EXAM: CT ABDOMEN AND PELVIS WITHOUT CONTRAST TECHNIQUE: Multidetector CT imaging of the abdomen and pelvis was performed following the standard protocol without IV contrast. COMPARISON:  CT scan of Sep 18, 2010. FINDINGS: Lower chest: No acute abnormality. Hepatobiliary: No focal liver abnormality is seen. No gallstones, gallbladder wall thickening, or biliary dilatation. Pancreas: Unremarkable. No pancreatic ductal dilatation or surrounding inflammatory changes. Spleen: Normal in size without focal abnormality. Adrenals/Urinary Tract: Adrenal glands appear normal. Mild right hydroureteronephrosis with perinephric stranding is noted secondary to 4 mm calculus in distal right ureter. Left kidney and ureter are unremarkable. Urinary bladder is unremarkable. Stomach/Bowel: Stomach is within normal limits. Appendix appears normal. No evidence of bowel wall thickening, distention, or inflammatory changes. Vascular/Lymphatic: No significant vascular findings are present.  No enlarged abdominal or pelvic lymph nodes. Reproductive: Prostate is unremarkable. Other: No abdominal wall hernia or abnormality. No abdominopelvic ascites. Musculoskeletal: No acute or significant osseous findings. IMPRESSION: Mild right hydroureteronephrosis with perinephric stranding is noted secondary to 4 mm calculus in distal right ureter. Electronically Signed   By: Lupita Raider, M.D.   On: 05/21/2018 14:48    Procedures Procedures (including critical care time)  Medications Ordered in ED Medications  sodium chloride flush (NS) 0.9 % injection 3 mL (3 mLs Intravenous Given 05/21/18 1356)  ondansetron (ZOFRAN) injection 4 mg (4 mg Intravenous Given 05/21/18 1354)  morphine 4 MG/ML injection 4 mg (4 mg Intravenous Given 05/21/18 1404)  sodium chloride 0.9 % bolus 1,000 mL (1,000 mLs Intravenous New Bag/Given 05/21/18 1404)     Initial Impression / Assessment and Plan / ED Course  I have reviewed the triage vital signs and the nursing notes.  Pertinent labs & imaging results that were available during my care of the patient were reviewed by me and considered in my medical decision making (see chart for details).   Clinical Course as of May 21 1506  Sat May 21, 2018  1450 Creatinine(!): 1.54 [CB]    Clinical Course User Index [CB] Bridget Hartshorn, DO  38 y/o gentleman with h/o HTN and nephrolithiasis presents with acute onset flank pain radiating into the abdomen with associated nausea and vomiting. On exam he is diaphoretic and constantly moving to try and get comfortable with right flank and abdominal pain. Most likely etiologies are renal colic versus gastroenteritis. Will obtain CT stone study, U/A, CMP, and lipase.  Will treat supportively with IVFs, anti-emetics and analgesics.    Final Clinical Impressions(s) / ED Diagnoses   Final diagnoses:  Kidney stone  Essential hypertension  CT confirmed 4 mm calculus in distal right ureter. Patient has improved clinically. Will  provide short course of Percocet, Zofran and Flomax until he passes the stone at home.  His renal function has been trending up for the last several years which is likely in the setting of longstanding uncontrolled HTN. Have made referral for him to establish care at [facility], as he likely needs escalation in anti-hypertensive therapy.    ED Discharge Orders         Ordered    oxyCODONE-acetaminophen (PERCOCET) 5-325 MG tablet  Every 4 hours PRN     05/21/18 1506    tamsulosin (FLOMAX) 0.4 MG CAPS capsule  Daily after breakfast     05/21/18 1506    ondansetron (ZOFRAN) 4 MG tablet  Every 8 hours PRN     05/21/18 1506           Lenward Chancellor D, DO 05/21/18 1511    Jacalyn Lefevre, MD 05/21/18 1542

## 2018-05-21 NOTE — ED Notes (Signed)
Patient transported to CT 

## 2018-05-21 NOTE — ED Notes (Signed)
Pt aware urine sample is needed-unable to go at this time, used restroom before getting into room

## 2018-11-22 DIAGNOSIS — Z20828 Contact with and (suspected) exposure to other viral communicable diseases: Secondary | ICD-10-CM | POA: Diagnosis not present

## 2019-03-24 IMAGING — CT CT RENAL STONE PROTOCOL
2 of 4 series · 17 of 46 positions shown, 19 images · non-contrast
Comparison: CT scan of September 18, 2010.

CLINICAL DATA: Acute right flank pain.

EXAM:
CT ABDOMEN AND PELVIS WITHOUT CONTRAST
TECHNIQUE: Multidetector CT imaging of the abdomen and pelvis was performed
following the standard protocol without IV contrast.

[Series 2: axial st · axial · 0.79mm/px · z∈[-689,-244]mm · 14 of 101 slices shown, 16 images]
[im 6/101  soft-tissue]
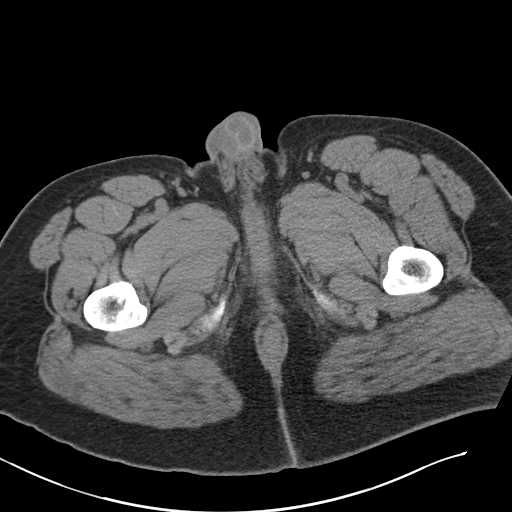
[im 6/101  bone]
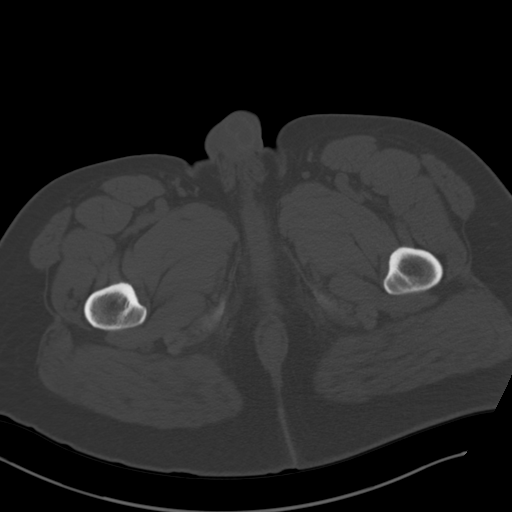
[im 12/101  soft-tissue]
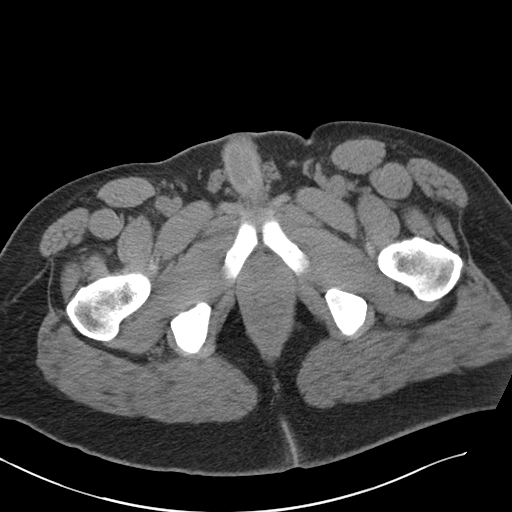
[im 23/101  soft-tissue]
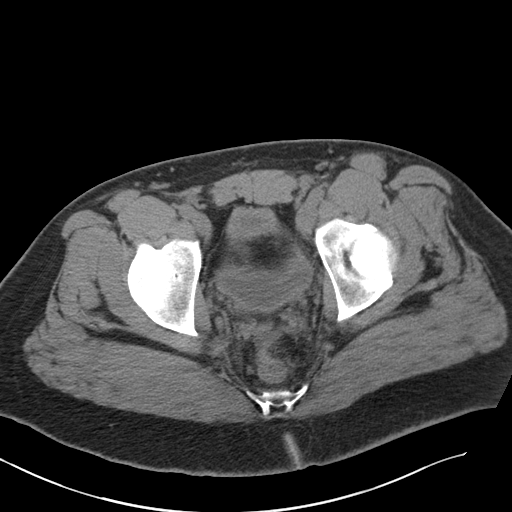
[im 28/101  soft-tissue]
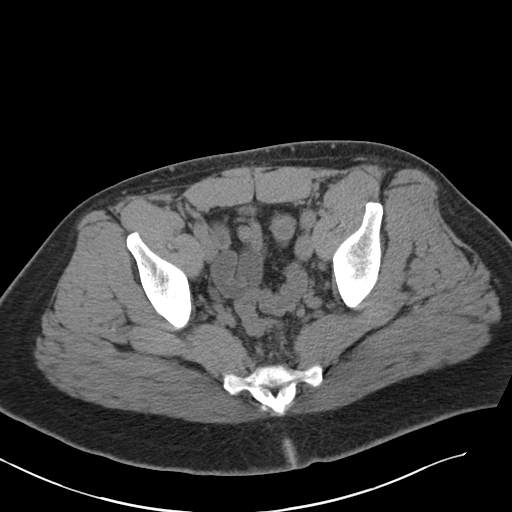
[im 34/101  soft-tissue]
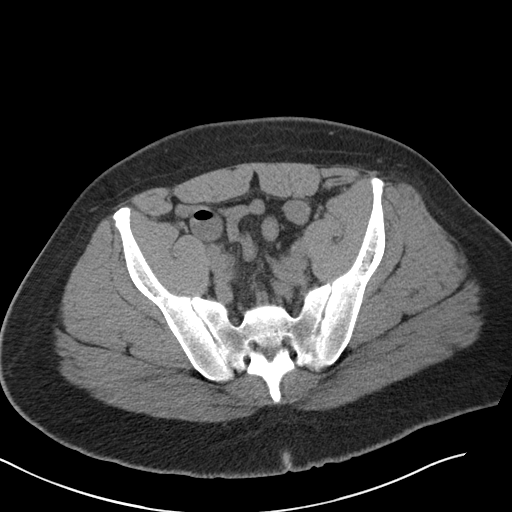
[im 39/101  soft-tissue]
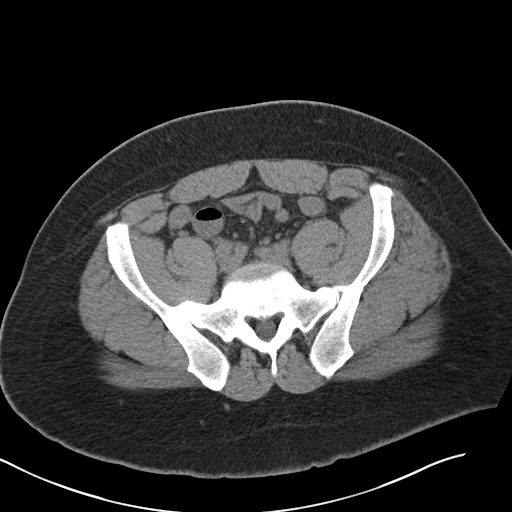
[im 45/101  soft-tissue]
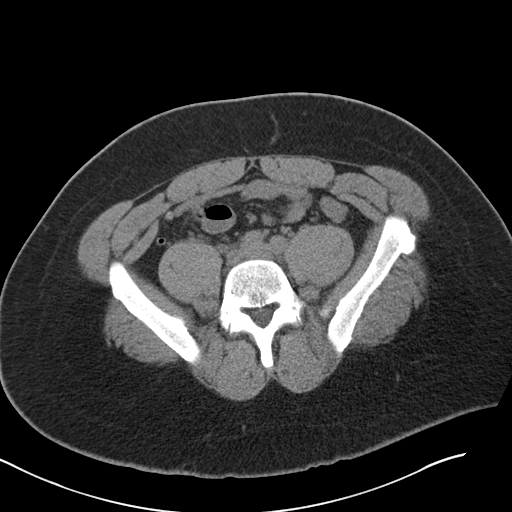
[im 56/101  soft-tissue]
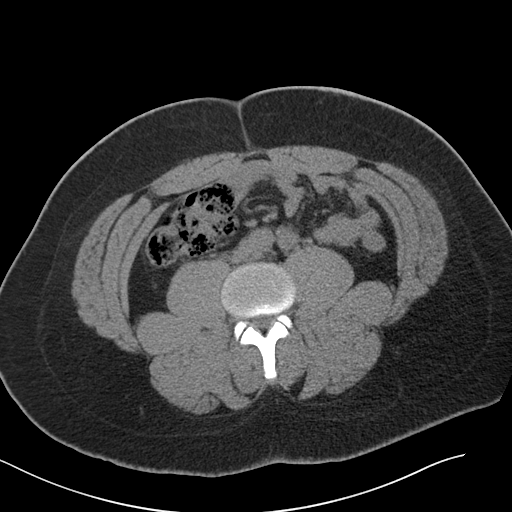
[im 62/101  soft-tissue]
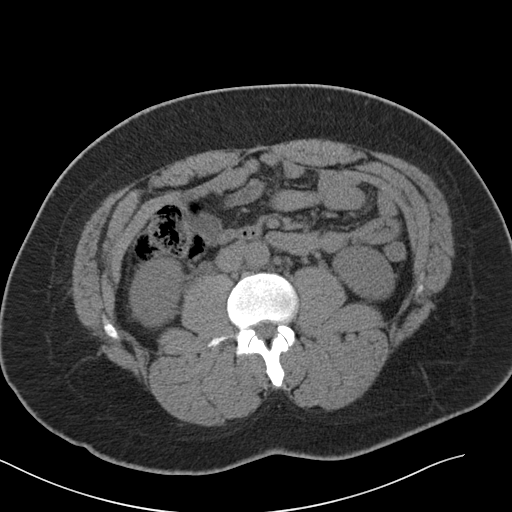
[im 62/101  bone]
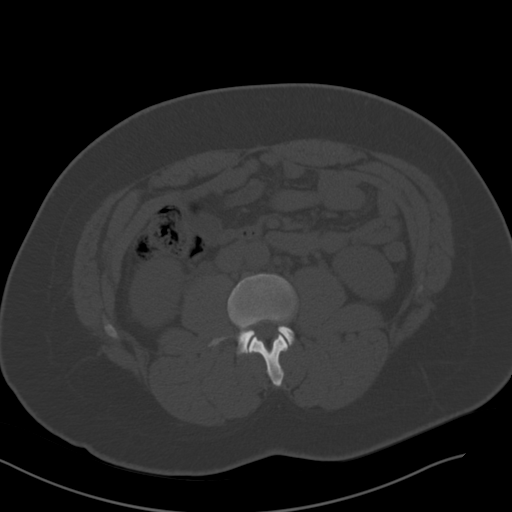
[im 67/101  soft-tissue]
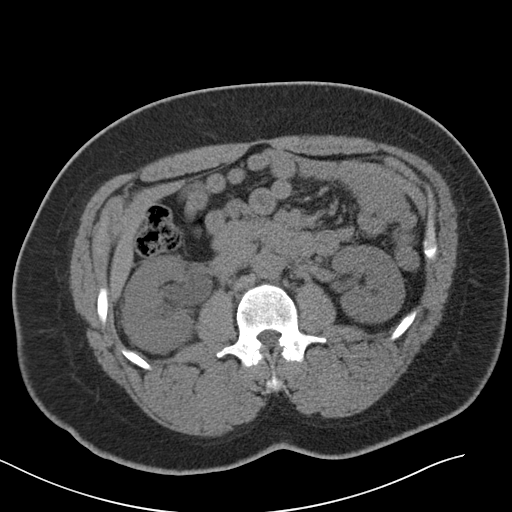
[im 73/101  soft-tissue]
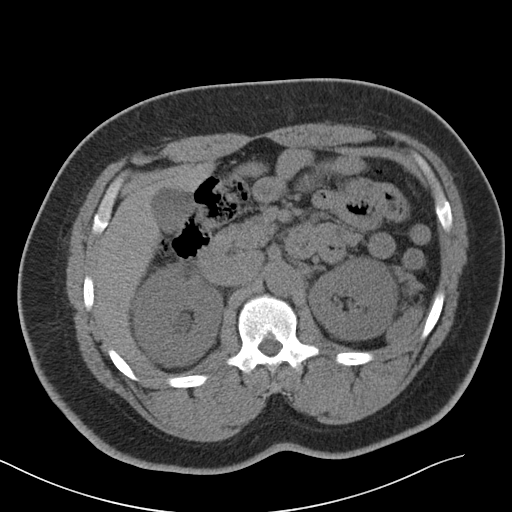
[im 78/101  soft-tissue]
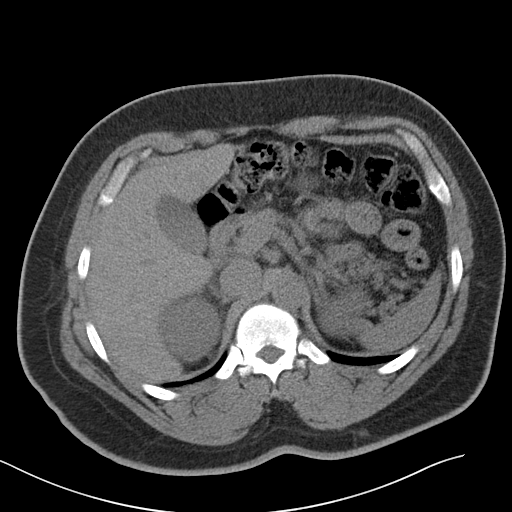
[im 89/101  soft-tissue]
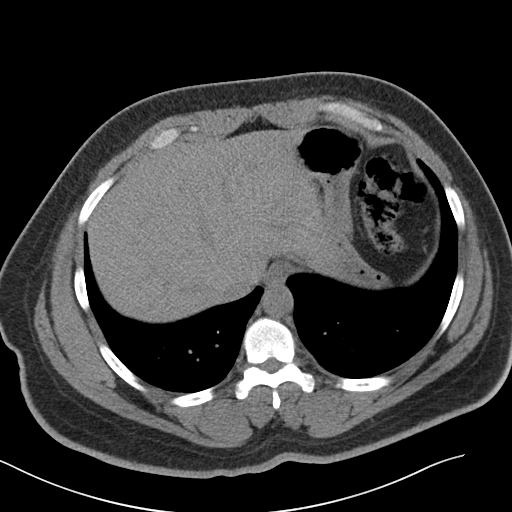
[im 95/101  soft-tissue]
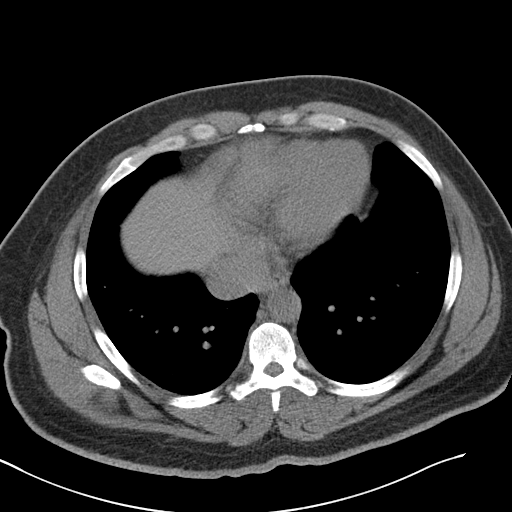

[Series 5: coronal · coronal · 0.91mm/px · 3 of 151 slices shown]
[im 51/151  soft-tissue]
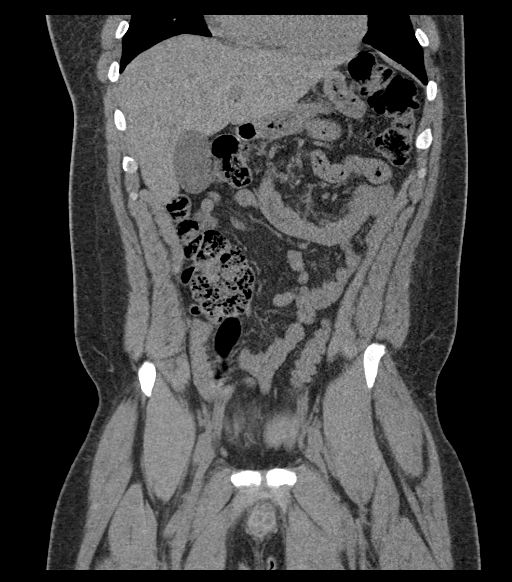
[im 67/151  soft-tissue]
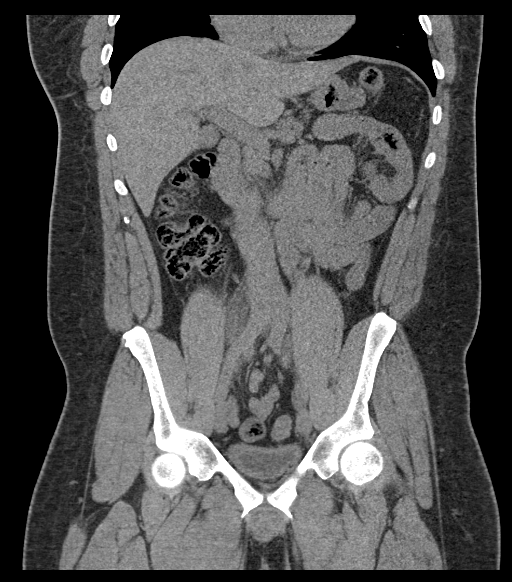
[im 84/151  soft-tissue]
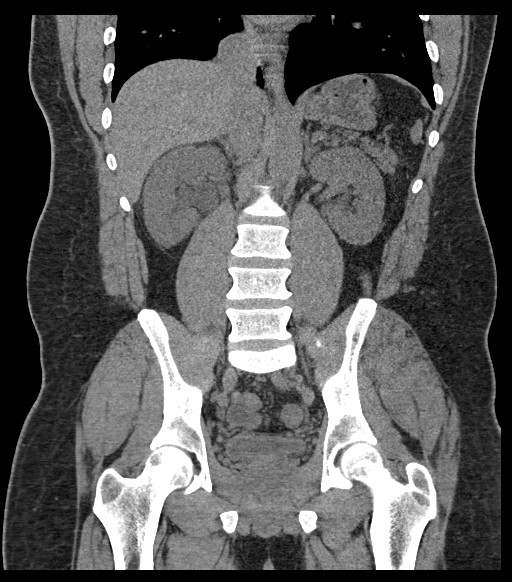

[17 of 46 positions shown; findings below may reference images not displayed]

FINDINGS: Lower chest: No acute abnormality.

Hepatobiliary: No focal liver abnormality is seen. No gallstones,
gallbladder wall thickening, or biliary dilatation.

Pancreas: Unremarkable. No pancreatic ductal dilatation or
surrounding inflammatory changes.

Spleen: Normal in size without focal abnormality.

Adrenals/Urinary Tract: Adrenal glands appear normal. Mild right
hydroureteronephrosis with perinephric stranding is noted secondary
to 4 mm calculus in distal right ureter. Left kidney and ureter are
unremarkable. Urinary bladder is unremarkable.

Stomach/Bowel: Stomach is within normal limits. Appendix appears
normal. No evidence of bowel wall thickening, distention, or
inflammatory changes.

Vascular/Lymphatic: No significant vascular findings are present. No
enlarged abdominal or pelvic lymph nodes.

Reproductive: Prostate is unremarkable.

Other: No abdominal wall hernia or abnormality. No abdominopelvic
ascites.

Musculoskeletal: No acute or significant osseous findings.
IMPRESSION: Mild right hydroureteronephrosis with perinephric stranding is noted
secondary to 4 mm calculus in distal right ureter.

## 2019-07-17 ENCOUNTER — Ambulatory Visit
Admission: EM | Admit: 2019-07-17 | Discharge: 2019-07-17 | Disposition: A | Discharge status: Home / Self Care | Payer: 59 | Attending: Physician Assistant | Admitting: Physician Assistant

## 2019-07-17 DIAGNOSIS — M25572 Pain in left ankle and joints of left foot: Secondary | ICD-10-CM | POA: Diagnosis not present

## 2019-07-17 MED ORDER — HYDROCHLOROTHIAZIDE 50 MG PO TABS: 50.0000 mg | ORAL_TABLET | Freq: Every day | ORAL | 0 refills | Status: DC

## 2019-07-17 MED ORDER — DICLOFENAC SODIUM 50 MG PO TBEC: 50.0000 mg | DELAYED_RELEASE_TABLET | Freq: Two times a day (BID) | ORAL | 0 refills | Status: DC

## 2019-07-17 NOTE — ED Triage Notes (Signed)
Pt c/o lt ankle pain for over a month off and on. 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 States past 2 having popping and burning pain to lt ankle.

## 2019-07-17 NOTE — Discharge Instructions (Signed)
Start Diclofenac. Do not take ibuprofen (motrin/advil)/ naproxen (aleve) while on diclofenac. Ice compress, elevation, rest, ankle brace during activity. Follow up with PCP/sports medicine if symptoms not improving.

## 2019-07-17 NOTE — ED Provider Notes (Signed)
[facility]    CSN: [id number] Arrival date & time: 07/17/19  1020      History   Chief Complaint Chief Complaint  Patient presents with  . Ankle Pain    HPI Jared Turner is a 39 y.o. male.   39 year old male comes in for 1 month history of intermittent atraumatic left ankle pain. For the past 2 days, felt poppign and burning pain to the left ankle. Denies swelling. Has had numbness/tingling to the lateral foot intermittently as well. Has had increased stiffness to the ankle with worsening symptoms standing. Has not taken anything for the symptoms. Has tried ankle sleeve without much relief. Denies personal history of DM     Past Medical History:  Diagnosis Date  . Asthma   . Hypertension   . Nephrolithiasis     There are no problems to display for this patient.   Past Surgical History:  Procedure Laterality Date  . ARTERY REPAIR Right arm       Home Medications    Prior to Admission medications   Medication Sig Start Date End Date Taking? Authorizing Provider  albuterol (PROVENTIL HFA;VENTOLIN HFA) 108 (90 BASE) MCG/ACT inhaler Inhale 2 puffs into the lungs 2 (two) times daily as needed for wheezing or shortness of breath. For shortness of breath.    [provider]  Ascorbic Acid (VITAMIN C PO) Take 1 tablet by mouth daily.    [provider]  aspirin-acetaminophen-caffeine (EXCEDRIN MIGRAINE) [phone] MG per tablet Take 1 tablet by mouth 2 (two) times daily as needed (pain).    [provider]  b complex vitamins tablet Take 1 tablet by mouth daily.    [provider]  Cyanocobalamin (VITAMIN B-12 PO) Take 1 tablet by mouth daily.    [provider]  diclofenac (VOLTAREN) 50 MG EC tablet Take 1 tablet (50 mg total) by mouth 2 (two) times daily. 07/17/19   Tasia Catchings, Ravleen Ries V, PA-C  hydrochlorothiazide (HYDRODIURIL) 50 MG tablet Take 1 tablet (50 mg total) by mouth daily. 07/17/19   Tasia Catchings, Santoria Chason V, PA-C  ibuprofen  (ADVIL,MOTRIN) 600 MG tablet Take 1 tablet (600 mg total) by mouth every 6 (six) hours as needed. 05/19/15   Jeannett Senior, PA-C    Family History History reviewed. No pertinent family history.  Social History Social History   Tobacco Use  . Smoking status: Current Every Day Smoker    Packs/day: 0.50  . Smokeless tobacco: Never Used  Substance Use Topics  . Alcohol use: Yes    Comment: rarely  . Drug use: No    Frequency: 3.0 times per week     Allergies   Milk-related compounds and Tylenol [acetaminophen]   Review of Systems Review of Systems  Reason unable to perform ROS: See HPI as above.     Physical Exam Triage Vital Signs ED Triage Vitals  Enc Vitals Group     BP      Pulse      Resp      Temp      Temp src      SpO2      Weight      Height      Head Circumference      Peak Flow      Pain Score      Pain Loc      Pain Edu?      Excl. in [place]?    No data found.  Updated Vital Signs  BP (!) 188/171 (BP Location: Left Arm) Comment: states hasn't had his b/p in over a week  Pulse 74   Temp 98.7 F (37.1 C) (Oral)   Resp 16   SpO2 97%   Physical Exam Constitutional:      General: He is not in acute distress.    Appearance: Normal appearance. He is well-developed. He is not toxic-appearing or diaphoretic.  HENT:     Head: Normocephalic and atraumatic.  Eyes:     Conjunctiva/sclera: Conjunctivae normal.     Pupils: Pupils are equal, round, and reactive to light.  Pulmonary:     Effort: Pulmonary effort is normal. No respiratory distress.     Comments: Speaking in full sentences without difficulty Musculoskeletal:     Cervical back: Normal range of motion and neck supple.     Comments: Swelling to the lateral ankle without erythema, warmth, contusion. Tenderness to palpation of lateral malleolus and surrounding area. No tenderness to the foot. Full ROM. Strength 5/5. Sensation intact. Pedal pulse 2+  Skin:    General: Skin is warm and dry.    Neurological:     Mental Status: He is alert and oriented to person, place, and time.      UC Treatments / Results  Labs (all labs ordered are listed, but only abnormal results are displayed) Labs Reviewed - No data to display  EKG   Radiology No results found.  Procedures Procedures (including critical care time)  Medications Ordered in UC Medications - No data to display  Initial Impression / Assessment and Plan / UC Course  I have reviewed the triage vital signs and the nursing notes.  Pertinent labs & imaging results that were available during my care of the patient were reviewed by me and considered in my medical decision making (see chart for details).    Atraumatic ankle pain, ? Tendinitis. Will start NSAIDs, ice compress, rest, ASO brace. Return precautions given.  Patient has PCP appointment in 2 weeks. States ran out of HTN medications. Will refill HCTZ 50mg  QD x 14 days. Patient to follow up with PCP for further refills and management needed.  Final Clinical Impressions(s) / UC Diagnoses   Final diagnoses:  Acute left ankle pain    ED Prescriptions    Medication Sig Dispense Auth. Provider   diclofenac (VOLTAREN) 50 MG EC tablet Take 1 tablet (50 mg total) by mouth 2 (two) times daily. 30 tablet Aneudy Champlain V, PA-C   hydrochlorothiazide (HYDRODIURIL) 50 MG tablet Take 1 tablet (50 mg total) by mouth daily. 14 tablet 02-20-1976, PA-C     PDMP not reviewed this encounter.   Belinda Fisher, PA-C 07/17/19 1109

## 2019-07-27 ENCOUNTER — Telehealth (INDEPENDENT_AMBULATORY_CARE_PROVIDER_SITE_OTHER): Payer: 59 | Admitting: Internal Medicine

## 2019-07-27 ENCOUNTER — Encounter: Payer: Self-pay | Admitting: Internal Medicine

## 2019-07-27 DIAGNOSIS — M25572 Pain in left ankle and joints of left foot: Secondary | ICD-10-CM

## 2019-07-27 DIAGNOSIS — Z7689 Persons encountering health services in other specified circumstances: Secondary | ICD-10-CM

## 2019-07-27 DIAGNOSIS — I10 Essential (primary) hypertension: Secondary | ICD-10-CM

## 2019-07-27 DIAGNOSIS — L989 Disorder of the skin and subcutaneous tissue, unspecified: Secondary | ICD-10-CM

## 2019-07-27 MED ORDER — HYDROCHLOROTHIAZIDE 50 MG PO TABS: 50.0000 mg | ORAL_TABLET | Freq: Every day | ORAL | 1 refills | Status: DC

## 2019-07-27 NOTE — Progress Notes (Signed)
Virtual Visit via Telephone Note  I connected with Jared Turner, on 07/27/2019 at 10:13 AM by telephone due to the COVID-19 pandemic and verified that I am speaking with the correct person using two identifiers.   Consent: I discussed the limitations, risks, security and privacy concerns of performing an evaluation and management service by telephone and the availability of in person appointments. I also discussed with the patient that there may be a patient responsible charge related to this service. The patient expressed understanding and agreed to proceed.   Location of Patient: Home   Location of Provider: Clinic    Persons participating in Telemedicine visit: Mordecai Tindol [facility] Dr. Juleen China    History of Present Illness: Patient has a visit to establish care. Patient has a PMH of nephrolithiasis, HTN, mild asthma.   Chronic HTN Disease Monitoring:  Home BP Monitoring - Yes, 120/80 on average  Chest pain- no  Dyspnea- no Headache - no  Medications: HCTZ 50 mg  Compliance- yes Lightheadedness- no  Edema- no    Patient has left ankle pain. Was seen in UC on 3/29 for this complaint. This pain has been present for 2.5 weeks. He works 10 hour days. He will start having pain 6-7 hours into the shift. It worsens when he is walking up on a step hill and feels the pain in the front of his ankle. Does report injury where he stepped out of [employer] truck and felt a pop when he was on the curb. The pain started a couple weeks later. He is wearing an ankle brace and that does help. He is also taking NSAIDs.      Past Medical History:  Diagnosis Date  . Asthma   . Hypertension   . Nephrolithiasis    Allergies  Allergen Reactions  . Aspirin Other (See Comments)    Chest tightness  . Milk-Related Compounds Other (See Comments)    Bloating and shortness of breath  . Pseudoephedrine-Acetaminophen Other (See Comments)    Chest tightness Chest  tightness   . Tylenol [Acetaminophen]     Current Outpatient Medications on File Prior to Visit  Medication Sig Dispense Refill  . diclofenac (VOLTAREN) 50 MG EC tablet Take 1 tablet (50 mg total) by mouth 2 (two) times daily. 30 tablet 0  . hydrochlorothiazide (HYDRODIURIL) 50 MG tablet Take 1 tablet (50 mg total) by mouth daily. 14 tablet 0   No current facility-administered medications on file prior to visit.    Observations/Objective: NAD. Speaking clearly.  Work of breathing normal.  Alert and oriented. Mood appropriate.   Assessment and Plan: 1. Encounter to establish care  2. Essential hypertension BPs at goal at home. Continue current therapy.  - hydrochlorothiazide (HYDRODIURIL) 50 MG tablet; Take 1 tablet (50 mg total) by mouth daily.  Dispense: 90 tablet; Refill: 1  3. Acute left ankle pain Sounds consistent with an ankle sprain and questionable tendonitis. Continue to use ankle brace, ice, NSAIDs. PT referral.  - Ambulatory referral to Physical Therapy  4. 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 He has a facial lesion on his cheek that has been present >1 year and requests new referral.  - Ambulatory referral to Dermatology   Follow Up Instructions: Annual exam with fasting labs    I discussed the assessment and treatment plan with the patient. The patient was provided an opportunity to ask questions and all were answered. The patient  agreed with the plan and demonstrated an understanding of the instructions.   The patient was advised to call back or seek an in-person evaluation if the symptoms worsen or if the condition fails to improve as anticipated.     I provided 16 minutes total of non-face-to-face time during this encounter including median intraservice time, reviewing previous notes, investigations, ordering medications, medical decision making, coordinating care and patient verbalized  understanding at the end of the visit.    Marcy Siren, D.O. Primary Care at [facility]  07/27/2019, 10:13 AM

## 2019-09-27 ENCOUNTER — Ambulatory Visit
Admission: EM | Admit: 2019-09-27 | Discharge: 2019-09-27 | Disposition: A | Discharge status: Home / Self Care | Payer: 59 | Attending: Emergency Medicine | Admitting: Emergency Medicine

## 2019-09-27 ENCOUNTER — Other Ambulatory Visit: Payer: Self-pay

## 2019-09-27 DIAGNOSIS — L0201 Cutaneous abscess of face: Secondary | ICD-10-CM | POA: Diagnosis not present

## 2019-09-27 MED ORDER — DOXYCYCLINE HYCLATE 100 MG PO CAPS: 100.0000 mg | ORAL_CAPSULE | Freq: Two times a day (BID) | ORAL | 0 refills | Status: DC

## 2019-09-27 NOTE — ED Provider Notes (Signed)
[facility]    CSN: [id number] Arrival date & time: 09/27/19  1010      History   Chief Complaint Chief Complaint  Patient presents with  . Abscess    HPI Jared Turner is a 39 y.o. male with history of asthma, hypertension presenting for facial abscess.  Patient states is been on the left side of his face for the last 3 days.  States he was able to express some purulence the other night.  Endorsing firmness, tenderness.  No dental pain, difficulty breathing or swallowing, throat pain, fever.   Past Medical History:  Diagnosis Date  . Asthma   . Hypertension   . Nephrolithiasis     Patient Active Problem List   Diagnosis Date Noted  . Essential hypertension 07/27/2019    Past Surgical History:  Procedure Laterality Date  . ARTERY REPAIR Right arm       Home Medications    Prior to Admission medications   Medication Sig Start Date End Date Taking? Authorizing Provider  doxycycline (VIBRAMYCIN) 100 MG capsule Take 1 capsule (100 mg total) by mouth 2 (two) times daily. 09/27/19   Hall-Potvin, [place], PA-C  hydrochlorothiazide (HYDRODIURIL) 50 MG tablet Take 1 tablet (50 mg total) by mouth daily. 07/27/19   Nicolette Bang, DO    Family History History reviewed. No pertinent family history.  Social History Social History   Tobacco Use  . Smoking status: Current Every Day Smoker    Packs/day: 0.50  . Smokeless tobacco: Never Used  Substance Use Topics  . Alcohol use: Yes    Comment: rarely  . Drug use: No    Frequency: 3.0 times per week     Allergies   Aspirin, Milk-related compounds, Pseudoephedrine-acetaminophen, and Tylenol [acetaminophen]   Review of Systems As per HPI   Physical Exam Triage Vital Signs ED Triage Vitals [09/27/19 1024]  Enc Vitals Group     BP (!) 165/121     Pulse Rate 74     Resp 18     Temp 98.5 F (36.9 C)     Temp Source Oral     SpO2 98 %     Weight      Height      Head  Circumference      Peak Flow      Pain Score 0     Pain Loc      Pain Edu?      Excl. in [place]?    No data found.  Updated Vital Signs BP (!) 165/121 (BP Location: Left Arm)   Pulse 74   Temp 98.5 F (36.9 C) (Oral)   Resp 18   SpO2 98%   Visual Acuity Right Eye Distance:   Left Eye Distance:   Bilateral Distance:    Right Eye Near:   Left Eye Near:    Bilateral Near:     Physical Exam Constitutional:      General: He is not in acute distress.    Appearance: He is not ill-appearing.  HENT:     Head: Normocephalic and atraumatic.     Right Ear: Tympanic membrane, ear canal and external ear normal.     Left Ear: Tympanic membrane, ear canal and external ear normal.     Nose: Nose normal.     Mouth/Throat:     Mouth: Mucous membranes are moist.     Pharynx: Oropharynx is clear.  Eyes:     General: No scleral icterus.  Conjunctiva/sclera: Conjunctivae normal.     Pupils: Pupils are equal, round, and reactive to light.  Cardiovascular:     Rate and Rhythm: Normal rate.  Pulmonary:     Effort: Pulmonary effort is normal. No respiratory distress.     Breath sounds: No wheezing.  Musculoskeletal:     Cervical back: Neck supple. No tenderness.  Lymphadenopathy:     Cervical: No cervical adenopathy.  Skin:    Coloration: Skin is not jaundiced or pale.     Comments: 2 cm area of induration and tenderness over left zygomatic arch.  TTP.  Warm.  Neurological:     Mental Status: He is alert and oriented to person, place, and time.      UC Treatments / Results  Labs (all labs ordered are listed, but only abnormal results are displayed) Labs Reviewed - No data to display  EKG   Radiology No results found.  Procedures Procedures (including critical care time)  Medications Ordered in UC Medications - No data to display  Initial Impression / Assessment and Plan / UC Course  I have reviewed the triage vital signs and the nursing notes.  Pertinent labs &  imaging results that were available during my care of the patient were reviewed by me and considered in my medical decision making (see chart for details).     Febrile, nontoxic in office today.  Patient does have significant pain, though does not want to have incision.  Needle I&D without purulence.  Will start doxycycline as outlined below.  Return precautions discussed, patient verbalized understanding and is agreeable to plan. Final Clinical Impressions(s) / UC Diagnoses   Final diagnoses:  Abscess of face     Discharge Instructions     Keep area(s) clean and dry. Apply hot compress / towel for 5-10 minutes 3-5 times daily. Take antibiotic as prescribed with food - important to complete course. Return for worsening pain, redness, swelling, discharge, fever.  Helpful prevention tips: Keep nails short to avoid secondary skin infections. Use new, clean razors when shaving. Avoid antiperspirants - look for deodorants without aluminum. Avoid wearing underwire bras as this can irritate the area further.     ED Prescriptions    Medication Sig Dispense Auth. Provider   doxycycline (VIBRAMYCIN) 100 MG capsule Take 1 capsule (100 mg total) by mouth 2 (two) times daily. 20 capsule Hall-Potvin, [place], PA-C     PDMP not reviewed this encounter.   Hall-Potvin, [place], [place] 09/27/19 1116

## 2019-09-27 NOTE — Discharge Instructions (Signed)
Keep area(s) clean and dry. °Apply hot compress / towel for 5-10 minutes 3-5 times daily. °Take antibiotic as prescribed with food - important to complete course. °Return for worsening pain, redness, swelling, discharge, fever. ° °Helpful prevention tips: °Keep nails short to avoid secondary skin infections. °Use new, clean razors when shaving. °Avoid antiperspirants - look for deodorants without aluminum. °Avoid wearing underwire bras as this can irritate the area further.  °

## 2019-09-27 NOTE — ED Triage Notes (Signed)
Pt c/o abscess to lt side of cheek x3 days. States abscess has drained and now hard to touch.

## 2019-12-20 ENCOUNTER — Encounter: Payer: Self-pay | Admitting: Plastic Surgery

## 2019-12-20 ENCOUNTER — Other Ambulatory Visit: Payer: Self-pay

## 2019-12-20 ENCOUNTER — Ambulatory Visit (INDEPENDENT_AMBULATORY_CARE_PROVIDER_SITE_OTHER): Payer: 59 | Admitting: Plastic Surgery

## 2019-12-20 VITALS — BP 191/113 | HR 67 | Temp 98.7°F | Ht 72.0 in | Wt 278.2 lb

## 2019-12-20 DIAGNOSIS — R220 Localized swelling, mass and lump, head: Secondary | ICD-10-CM

## 2019-12-20 NOTE — Progress Notes (Signed)
   Referring Provider Arvilla Market, DO [address] [place],  [place] [address]   CC:  Chief Complaint  Patient presents with  . Advice Only      Jared Turner is an 39 y.o. male.  HPI: Patient is here to discuss a cystic lesion in his right cheek.  Been present for around a year.  It appears to be growing in size.  Is not painful and has not drained.  He also has cystic acne type issues with both cheeks and is tried topical and oral therapies with moderate success.  Allergies  Allergen Reactions  . Aspirin Other (See Comments)    Chest tightness  . Milk-Related Compounds Other (See Comments)    Bloating and shortness of breath  . Pseudoephedrine-Acetaminophen Other (See Comments)    Chest tightness Chest tightness   . Tylenol [Acetaminophen]     Outpatient Encounter Medications as of 12/20/2019  Medication Sig  . hydrochlorothiazide (HYDRODIURIL) 50 MG tablet Take 1 tablet (50 mg total) by mouth daily. (Patient not taking: Reported on 12/20/2019)  . [DISCONTINUED] doxycycline (VIBRAMYCIN) 100 MG capsule Take 1 capsule (100 mg total) by mouth 2 (two) times daily.   No facility-administered encounter medications on file as of 12/20/2019.     Past Medical History:  Diagnosis Date  . Asthma   . Hypertension   . Nephrolithiasis     Past Surgical History:  Procedure Laterality Date  . ARTERY REPAIR Right arm    No family history on file.  Social History   Social History Narrative  . Not on file     Review of Systems General: Denies fevers, chills, weight loss CV: Denies chest pain, shortness of breath, palpitations  Physical Exam Vitals with BMI 12/20/2019 09/27/2019 07/17/2019  Height 6\' 0"  - -  Weight 278 lbs 3 oz - -  BMI 37.72 - -  Systolic 191 165  Diastolic 113 121 619  Pulse 67 74 74    General:  No acute distress,  Alert and oriented, Non-Toxic, Normal speech and affect Examination shows a 5 cm cystic lesion in the right cheek  around the area of the mandibular angle.  There is no overlying skin changes.  The area is not fluctuant or tender.  There is no erythema.  It does feel mobile.  There is no surrounding scars.  Cranial nerves grossly intact.  Assessment/Plan Patient presents with a cystic lesion in the right cheek.  I recommended excision.  We discussed the risks include bleeding, infection, damage to surrounding structures and need for additional procedures.  I discussed the potential for recurrence.  I discussed the anatomy in this area thinking about the parotid and facial nerve and recommended OR excision of this to better visualize any deeper structures.  He is in agreement with that plan.  We will plan to set this up to be done in the operating room.  All of his questions were answered.  509 12/20/2019, 10:56 AM

## 2020-01-03 ENCOUNTER — Other Ambulatory Visit: Payer: Self-pay

## 2020-01-03 ENCOUNTER — Ambulatory Visit (INDEPENDENT_AMBULATORY_CARE_PROVIDER_SITE_OTHER): Payer: 59 | Admitting: Surgical

## 2020-01-03 ENCOUNTER — Telehealth: Payer: Self-pay | Admitting: Plastic Surgery

## 2020-01-03 ENCOUNTER — Encounter: Payer: Self-pay | Admitting: Surgical

## 2020-01-03 VITALS — BP 169/107 | HR 71 | Temp 98.7°F | Ht 72.0 in | Wt 267.8 lb

## 2020-01-03 DIAGNOSIS — R220 Localized swelling, mass and lump, head: Secondary | ICD-10-CM

## 2020-01-03 MED ORDER — ONDANSETRON HCL 4 MG PO TABS: 4.0000 mg | ORAL_TABLET | Freq: Three times a day (TID) | ORAL | 0 refills | Status: DC | PRN

## 2020-01-03 MED ORDER — HYDROCODONE-ACETAMINOPHEN 5-325 MG PO TABS: 1.0000 | ORAL_TABLET | Freq: Four times a day (QID) | ORAL | 0 refills | Status: AC | PRN

## 2020-01-03 NOTE — Progress Notes (Signed)
Patient ID: Jared Turner, male    DOB: 1981/03/26, 39 y.o.   MRN: [id number]  Chief Complaint  Patient presents with  . Pre-op Exam      ICD-10-CM   1. Cheek mass  R22.0     History of Present Illness: Jared Turner is a 39 y.o.  male  with a history of right cheek cystic lesion that has been present for about a year and has recently been growing in size.  He presents for preoperative evaluation for upcoming procedure, excision of right cheek mass, scheduled for 01/16/2020 with Dr. Arita Miss  The patient has not had problems with anesthesia. No history of DVT/PE.  No family history of DVT/PE.  No family or personal history of bleeding or clotting disorders.  Patient is not currently taking any blood thinners.  No history of CVA/MI.  Patient reports he is overall been feeling fine lately, no fevers, chills, nausea, vomiting, chest pain, dizziness, weakness.  He does report that he has noticed some numbness and tingling in his bilateral upper extremities, mostly involving his ring finger and small finger.  He is planning to discuss this with his PCP.  Patient is a smoker, half pack per day.  Job: Drives a truck, delivery.  PMH Significant for: Asthma, hypertension, cystic acne. Patient reports he has been feeling fine lately, no recent colds or illnesses.   Past Medical History: Allergies: Allergies  Allergen Reactions  . Aspirin Other (See Comments)    Chest tightness  . Milk-Related Compounds Other (See Comments)    Bloating and shortness of breath  . Pseudoephedrine-Acetaminophen Other (See Comments)    Chest tightness Chest tightness   . Tylenol [Acetaminophen]     Current Medications:  Current Outpatient Medications:  .  hydrochlorothiazide (HYDRODIURIL) 50 MG tablet, Take 1 tablet (50 mg total) by mouth daily., Disp: 90 tablet, Rfl: 1  Past Medical Problems: Past Medical History:  Diagnosis Date  . Asthma   . Hypertension   . Nephrolithiasis     Past  Surgical History: Past Surgical History:  Procedure Laterality Date  . ARTERY REPAIR Right arm    Social History: Social History   Socioeconomic History  . Marital status: Single    Spouse name: Not on file  . Number of children: Not on file  . Years of education: Not on file  . Highest education level: Not on file  Occupational History  . Not on file  Tobacco Use  . Smoking status: Current Every Day Smoker    Packs/day: 0.50  . Smokeless tobacco: Never Used  Substance and Sexual Activity  . Alcohol use: Yes    Comment: rarely  . Drug use: No    Frequency: 3.0 times per week  . Sexual activity: Yes    Birth control/protection: Condom  Other Topics Concern  . Not on file  Social History Narrative  . Not on file   Social Determinants of Health   Financial Resource Strain:   . Difficulty of Paying Living Expenses: Not on file  Food Insecurity:   . Worried About Programme researcher, broadcasting/film/video in the Last Year: Not on file  . Ran Out of Food in the Last Year: Not on file  Transportation Needs:   . Lack of Transportation (Medical): Not on file  . Lack of Transportation (Non-Medical): Not on file  Physical Activity:   . Days of Exercise per Week: Not on file  . Minutes of Exercise per Session: Not  on file  Stress:   . Feeling of Stress : Not on file  Social Connections:   . Frequency of Communication with Friends and Family: Not on file  . Frequency of Social Gatherings with Friends and Family: Not on file  . Attends Religious Services: Not on file  . Active Member of Clubs or Organizations: Not on file  . Attends Banker Meetings: Not on file  . Marital Status: Not on file  Intimate Partner Violence:   . Fear of Current or Ex-Partner: Not on file  . Emotionally Abused: Not on file  . Physically Abused: Not on file  . Sexually Abused: Not on file    Family History: History reviewed. No pertinent family history.  Review of Systems: Review of Systems    Constitutional: Negative.   Respiratory: Negative.   Cardiovascular: Negative.   Gastrointestinal: Negative.   Neurological: Positive for tingling (Left and right hand) and sensory change (Left and right hand).    Physical Exam: Vital Signs BP (!) 169/107 (BP Location: Left Arm, Patient Position: Sitting, Cuff Size: Large)   Pulse 71   Temp 98.7 F (37.1 C) (Oral)   Ht 6' (1.829 m)   Wt 267 lb 12.8 oz (121.5 kg)   SpO2 100%   BMI 36.32 kg/m   Physical Exam Constitutional:      General: He is not in acute distress.    Appearance: Normal appearance. He is not ill-appearing.  HENT:     Head: Normocephalic and atraumatic.     Comments: Right cheek mass, approximately 5 cm, no overlying skin changes, no erythema  Cardiovascular:     Rate and Rhythm: Normal rate and regular rhythm.     Pulses: Normal pulses.  Pulmonary:     Effort: Pulmonary effort is normal.     Breath sounds: Normal breath sounds.  Abdominal:     General: Abdomen is flat. There is no distension.     Tenderness: There is no abdominal tenderness.  Musculoskeletal:        General: No swelling.     Right lower leg: No edema.     Left lower leg: No edema.  Skin:    General: Skin is warm and dry.  Neurological:     General: No focal deficit present.     Mental Status: He is alert and oriented to person, place, and time.  Psychiatric:        Mood and Affect: Mood normal.        Behavior: Behavior normal.     Assessment/Plan: The patient is scheduled for excision of right cheek mass on 01/16/2020 with Dr. Arita Miss. Risks, benefits, and alternatives of procedure discussed, questions answered and consent obtained.    Smoking Status: Every day smoker; Counseling Given?  Yes  Caprini Score: Two, moderate; Risk Factors include: Smoker, BMI greater than 25, and length of planned surgery. Recommendation for mechanical prophylaxis during surgery. Encourage early ambulation.   Patient's blood pressure has been  elevated at both his recent visits, discussed with patient that he needs to call his PCP today to discuss this.  Patient reports that he is planning to.  Post-op Rx sent to pharmacy: Zofran, Norco x3 days  Patient was provided with the General Surgical Risk consent document and Pain Medication Agreement prior to their appointment.  They had adequate time to read through the risk consent documents and Pain Medication Agreement. We also discussed them in person together during this preop appointment. All of their  questions were answered to their satisfaction.  Recommended calling if they have any further questions.  Risk consent form and Pain Medication Agreement to be scanned into patient's chart.  The risks that can be encountered with and after a skin excision and were discussed and include the following but not limited to these: bleeding, infection, delayed healing, anesthesia risks, skin sensation changes, injury to structures including nerves, blood vessels, and muscles which may be temporary or permanent, allergies to tape, suture materials and glues, blood products, topical preparations or injected agents, skin contour irregularities, skin discoloration and swelling, deep vein thrombosis, cardiac and pulmonary complications, pain, which may persist, possible need for revisional surgery.    Electronically signed by: Kermit Balo [place], PA-C 01/03/2020 10:39 AM

## 2020-01-03 NOTE — Telephone Encounter (Signed)
Called [facility] and spoke with [facility]. Verified patient had a pre-op visit today for excision of right cheek mass for surgery scheduled on 01/16/20. Prescription was sent out prior to surgery so that patient will can pick up medication before day of surgery.

## 2020-01-03 NOTE — H&P (View-Only) (Signed)
Patient ID: Jared Turner, male    DOB: 1981/03/26, 39 y.o.   MRN: [id number]  Chief Complaint  Patient presents with  . Pre-op Exam      ICD-10-CM   1. Cheek mass  R22.0     History of Present Illness: Jared Turner is a 39 y.o.  male  with a history of right cheek cystic lesion that has been present for about a year and has recently been growing in size.  He presents for preoperative evaluation for upcoming procedure, excision of right cheek mass, scheduled for 01/16/2020 with Dr. Arita Miss  The patient has not had problems with anesthesia. No history of DVT/PE.  No family history of DVT/PE.  No family or personal history of bleeding or clotting disorders.  Patient is not currently taking any blood thinners.  No history of CVA/MI.  Patient reports he is overall been feeling fine lately, no fevers, chills, nausea, vomiting, chest pain, dizziness, weakness.  He does report that he has noticed some numbness and tingling in his bilateral upper extremities, mostly involving his ring finger and small finger.  He is planning to discuss this with his PCP.  Patient is a smoker, half pack per day.  Job: Drives a truck, delivery.  PMH Significant for: Asthma, hypertension, cystic acne. Patient reports he has been feeling fine lately, no recent colds or illnesses.   Past Medical History: Allergies: Allergies  Allergen Reactions  . Aspirin Other (See Comments)    Chest tightness  . Milk-Related Compounds Other (See Comments)    Bloating and shortness of breath  . Pseudoephedrine-Acetaminophen Other (See Comments)    Chest tightness Chest tightness   . Tylenol [Acetaminophen]     Current Medications:  Current Outpatient Medications:  .  hydrochlorothiazide (HYDRODIURIL) 50 MG tablet, Take 1 tablet (50 mg total) by mouth daily., Disp: 90 tablet, Rfl: 1  Past Medical Problems: Past Medical History:  Diagnosis Date  . Asthma   . Hypertension   . Nephrolithiasis     Past  Surgical History: Past Surgical History:  Procedure Laterality Date  . ARTERY REPAIR Right arm    Social History: Social History   Socioeconomic History  . Marital status: Single    Spouse name: Not on file  . Number of children: Not on file  . Years of education: Not on file  . Highest education level: Not on file  Occupational History  . Not on file  Tobacco Use  . Smoking status: Current Every Day Smoker    Packs/day: 0.50  . Smokeless tobacco: Never Used  Substance and Sexual Activity  . Alcohol use: Yes    Comment: rarely  . Drug use: No    Frequency: 3.0 times per week  . Sexual activity: Yes    Birth control/protection: Condom  Other Topics Concern  . Not on file  Social History Narrative  . Not on file   Social Determinants of Health   Financial Resource Strain:   . Difficulty of Paying Living Expenses: Not on file  Food Insecurity:   . Worried About Programme researcher, broadcasting/film/video in the Last Year: Not on file  . Ran Out of Food in the Last Year: Not on file  Transportation Needs:   . Lack of Transportation (Medical): Not on file  . Lack of Transportation (Non-Medical): Not on file  Physical Activity:   . Days of Exercise per Week: Not on file  . Minutes of Exercise per Session: Not  on file  Stress:   . Feeling of Stress : Not on file  Social Connections:   . Frequency of Communication with Friends and Family: Not on file  . Frequency of Social Gatherings with Friends and Family: Not on file  . Attends Religious Services: Not on file  . Active Member of Clubs or Organizations: Not on file  . Attends Banker Meetings: Not on file  . Marital Status: Not on file  Intimate Partner Violence:   . Fear of Current or Ex-Partner: Not on file  . Emotionally Abused: Not on file  . Physically Abused: Not on file  . Sexually Abused: Not on file    Family History: History reviewed. No pertinent family history.  Review of Systems: Review of Systems    Constitutional: Negative.   Respiratory: Negative.   Cardiovascular: Negative.   Gastrointestinal: Negative.   Neurological: Positive for tingling (Left and right hand) and sensory change (Left and right hand).    Physical Exam: Vital Signs BP (!) 169/107 (BP Location: Left Arm, Patient Position: Sitting, Cuff Size: Large)   Pulse 71   Temp 98.7 F (37.1 C) (Oral)   Ht 6' (1.829 m)   Wt 267 lb 12.8 oz (121.5 kg)   SpO2 100%   BMI 36.32 kg/m   Physical Exam Constitutional:      General: He is not in acute distress.    Appearance: Normal appearance. He is not ill-appearing.  HENT:     Head: Normocephalic and atraumatic.     Comments: Right cheek mass, approximately 5 cm, no overlying skin changes, no erythema  Cardiovascular:     Rate and Rhythm: Normal rate and regular rhythm.     Pulses: Normal pulses.  Pulmonary:     Effort: Pulmonary effort is normal.     Breath sounds: Normal breath sounds.  Abdominal:     General: Abdomen is flat. There is no distension.     Tenderness: There is no abdominal tenderness.  Musculoskeletal:        General: No swelling.     Right lower leg: No edema.     Left lower leg: No edema.  Skin:    General: Skin is warm and dry.  Neurological:     General: No focal deficit present.     Mental Status: He is alert and oriented to person, place, and time.  Psychiatric:        Mood and Affect: Mood normal.        Behavior: Behavior normal.     Assessment/Plan: The patient is scheduled for excision of right cheek mass on 01/16/2020 with Dr. Arita Miss. Risks, benefits, and alternatives of procedure discussed, questions answered and consent obtained.    Smoking Status: Every day smoker; Counseling Given?  Yes  Caprini Score: Two, moderate; Risk Factors include: Smoker, BMI greater than 25, and length of planned surgery. Recommendation for mechanical prophylaxis during surgery. Encourage early ambulation.   Patient's blood pressure has been  elevated at both his recent visits, discussed with patient that he needs to call his PCP today to discuss this.  Patient reports that he is planning to.  Post-op Rx sent to pharmacy: Zofran, Norco x3 days  Patient was provided with the General Surgical Risk consent document and Pain Medication Agreement prior to their appointment.  They had adequate time to read through the risk consent documents and Pain Medication Agreement. We also discussed them in person together during this preop appointment. All of their  questions were answered to their satisfaction.  Recommended calling if they have any further questions.  Risk consent form and Pain Medication Agreement to be scanned into patient's chart.  The risks that can be encountered with and after a skin excision and were discussed and include the following but not limited to these: bleeding, infection, delayed healing, anesthesia risks, skin sensation changes, injury to structures including nerves, blood vessels, and muscles which may be temporary or permanent, allergies to tape, suture materials and glues, blood products, topical preparations or injected agents, skin contour irregularities, skin discoloration and swelling, deep vein thrombosis, cardiac and pulmonary complications, pain, which may persist, possible need for revisional surgery.    Electronically signed by: Kermit Balo [place], PA-C 01/03/2020 10:39 AM

## 2020-01-03 NOTE — Telephone Encounter (Signed)
[facility] called to advise they need documentation of diagnosis and verify type of pain patient is experiencing to require hydrocodone. [phone]

## 2020-01-11 ENCOUNTER — Other Ambulatory Visit: Payer: Self-pay

## 2020-01-11 ENCOUNTER — Encounter (HOSPITAL_BASED_OUTPATIENT_CLINIC_OR_DEPARTMENT_OTHER): Payer: Self-pay | Admitting: Plastic Surgery

## 2020-01-13 ENCOUNTER — Other Ambulatory Visit (HOSPITAL_COMMUNITY)
Admission: RE | Admit: 2020-01-13 | Discharge: 2020-01-13 | Disposition: A | Discharge status: Home / Self Care | Payer: 59 | Source: Ambulatory Visit | Attending: Plastic Surgery | Admitting: Plastic Surgery

## 2020-01-13 DIAGNOSIS — Z20822 Contact with and (suspected) exposure to covid-19: Secondary | ICD-10-CM | POA: Insufficient documentation

## 2020-01-13 DIAGNOSIS — Z01818 Encounter for other preprocedural examination: Secondary | ICD-10-CM | POA: Insufficient documentation

## 2020-01-13 LAB — SARS CORONAVIRUS 2 (TAT 6-24 HRS): SARS Coronavirus 2: NEGATIVE

## 2020-01-16 ENCOUNTER — Ambulatory Visit (HOSPITAL_BASED_OUTPATIENT_CLINIC_OR_DEPARTMENT_OTHER)
Admission: RE | Admit: 2020-01-16 | Discharge: 2020-01-16 | Disposition: A | Discharge status: Home / Self Care | Payer: 59 | Attending: Plastic Surgery | Admitting: Plastic Surgery

## 2020-01-16 ENCOUNTER — Ambulatory Visit (HOSPITAL_BASED_OUTPATIENT_CLINIC_OR_DEPARTMENT_OTHER): Payer: 59 | Admitting: Anesthesiology

## 2020-01-16 ENCOUNTER — Other Ambulatory Visit: Payer: Self-pay

## 2020-01-16 ENCOUNTER — Encounter (HOSPITAL_BASED_OUTPATIENT_CLINIC_OR_DEPARTMENT_OTHER): Payer: Self-pay | Admitting: Plastic Surgery

## 2020-01-16 ENCOUNTER — Encounter (HOSPITAL_BASED_OUTPATIENT_CLINIC_OR_DEPARTMENT_OTHER)
Admission: RE | Disposition: A | Discharge status: Home / Self Care | Payer: Self-pay | Source: Home / Self Care | Attending: Plastic Surgery

## 2020-01-16 DIAGNOSIS — Z888 Allergy status to other drugs, medicaments and biological substances status: Secondary | ICD-10-CM | POA: Insufficient documentation

## 2020-01-16 DIAGNOSIS — Z886 Allergy status to analgesic agent status: Secondary | ICD-10-CM | POA: Insufficient documentation

## 2020-01-16 DIAGNOSIS — Z91011 Allergy to milk products: Secondary | ICD-10-CM | POA: Diagnosis not present

## 2020-01-16 DIAGNOSIS — R69 Illness, unspecified: Secondary | ICD-10-CM | POA: Diagnosis not present

## 2020-01-16 DIAGNOSIS — L720 Epidermal cyst: Secondary | ICD-10-CM | POA: Insufficient documentation

## 2020-01-16 DIAGNOSIS — J45909 Unspecified asthma, uncomplicated: Secondary | ICD-10-CM | POA: Diagnosis not present

## 2020-01-16 DIAGNOSIS — R220 Localized swelling, mass and lump, head: Secondary | ICD-10-CM | POA: Diagnosis not present

## 2020-01-16 DIAGNOSIS — I10 Essential (primary) hypertension: Secondary | ICD-10-CM | POA: Insufficient documentation

## 2020-01-16 DIAGNOSIS — L728 Other follicular cysts of the skin and subcutaneous tissue: Secondary | ICD-10-CM | POA: Diagnosis not present

## 2020-01-16 DIAGNOSIS — F17210 Nicotine dependence, cigarettes, uncomplicated: Secondary | ICD-10-CM | POA: Insufficient documentation

## 2020-01-16 HISTORY — PX: MASS EXCISION: SHX2000

## 2020-01-16 LAB — BASIC METABOLIC PANEL
Anion gap: 12 (ref 5–15)
BUN: 13 mg/dL (ref 6–20)
CO2: 26 mmol/L (ref 22–32)
Calcium: 9.7 mg/dL (ref 8.9–10.3)
Chloride: 102 mmol/L (ref 98–111)
Creatinine, Ser: 1.3 mg/dL — ABNORMAL HIGH (ref 0.61–1.24)
GFR calc Af Amer: >60 mL/min (ref >60)
GFR calc non Af Amer: >60 mL/min (ref >60)
Glucose, Bld: 103 mg/dL — ABNORMAL HIGH (ref 70–99)
Potassium: 3.6 mmol/L (ref 3.5–5.1)
Sodium: 140 mmol/L (ref 135–145)

## 2020-01-16 SURGERY — EXCISION MASS
Anesthesia: General | Laterality: Right

## 2020-01-16 MED ORDER — LIDOCAINE HCL (CARDIAC) PF 100 MG/5ML IV SOSY
PREFILLED_SYRINGE | INTRAVENOUS | Status: DC | PRN
  Administered 2020-01-16: 50 mg via INTRAVENOUS

## 2020-01-16 MED ORDER — DEXAMETHASONE SODIUM PHOSPHATE 4 MG/ML IJ SOLN
INTRAMUSCULAR | Status: DC | PRN
  Administered 2020-01-16: 10 mg via INTRAVENOUS

## 2020-01-16 MED ORDER — PHENYLEPHRINE 40 MCG/ML (10ML) SYRINGE FOR IV PUSH (FOR BLOOD PRESSURE SUPPORT)
PREFILLED_SYRINGE | INTRAVENOUS | Status: AC
  Filled 2020-01-16: qty 10

## 2020-01-16 MED ORDER — LIDOCAINE HCL (PF) 1 % IJ SOLN
INTRAMUSCULAR | Status: AC
  Filled 2020-01-16: qty 30

## 2020-01-16 MED ORDER — LIDOCAINE-EPINEPHRINE 1 %-1:100000 IJ SOLN
INTRAMUSCULAR | Status: AC
  Filled 2020-01-16: qty 1

## 2020-01-16 MED ORDER — BACITRACIN 500 UNIT/GM EX OINT
TOPICAL_OINTMENT | CUTANEOUS | Status: DC | PRN
  Administered 2020-01-16: 1 via TOPICAL

## 2020-01-16 MED ORDER — MIDAZOLAM HCL 2 MG/2ML IJ SOLN
INTRAMUSCULAR | Status: AC
  Filled 2020-01-16: qty 2

## 2020-01-16 MED ORDER — CEFAZOLIN SODIUM-DEXTROSE 2-4 GM/100ML-% IV SOLN
INTRAVENOUS | Status: AC
  Filled 2020-01-16: qty 100

## 2020-01-16 MED ORDER — MIDAZOLAM HCL 5 MG/5ML IJ SOLN
INTRAMUSCULAR | Status: DC | PRN
  Administered 2020-01-16: 2 mg via INTRAVENOUS

## 2020-01-16 MED ORDER — FENTANYL CITRATE (PF) 100 MCG/2ML IJ SOLN: 25.0000 ug | INTRAMUSCULAR | Status: DC | PRN

## 2020-01-16 MED ORDER — BUPIVACAINE HCL (PF) 0.25 % IJ SOLN
INTRAMUSCULAR | Status: AC
  Filled 2020-01-16: qty 30

## 2020-01-16 MED ORDER — CEFAZOLIN SODIUM-DEXTROSE 2-4 GM/100ML-% IV SOLN
2.0000 g | INTRAVENOUS | Status: AC
  Administered 2020-01-16: 09:00:00 2 g via INTRAVENOUS

## 2020-01-16 MED ORDER — LIDOCAINE-EPINEPHRINE 1 %-1:100000 IJ SOLN
INTRAMUSCULAR | Status: DC | PRN
  Administered 2020-01-16: 20 mL via INTRADERMAL

## 2020-01-16 MED ORDER — PROPOFOL 10 MG/ML IV BOLUS
INTRAVENOUS | Status: DC | PRN
  Administered 2020-01-16: 200 mg via INTRAVENOUS

## 2020-01-16 MED ORDER — DEXAMETHASONE SODIUM PHOSPHATE 10 MG/ML IJ SOLN
INTRAMUSCULAR | Status: AC
  Filled 2020-01-16: qty 1

## 2020-01-16 MED ORDER — LIDOCAINE 2% (20 MG/ML) 5 ML SYRINGE
INTRAMUSCULAR | Status: AC
  Filled 2020-01-16: qty 5

## 2020-01-16 MED ORDER — LACTATED RINGERS IV SOLN: INTRAVENOUS | Status: DC

## 2020-01-16 MED ORDER — EPHEDRINE 5 MG/ML INJ
INTRAVENOUS | Status: AC
  Filled 2020-01-16: qty 10

## 2020-01-16 MED ORDER — ONDANSETRON HCL 4 MG/2ML IJ SOLN
INTRAMUSCULAR | Status: AC
  Filled 2020-01-16: qty 2

## 2020-01-16 MED ORDER — SUCCINYLCHOLINE CHLORIDE 200 MG/10ML IV SOSY
PREFILLED_SYRINGE | INTRAVENOUS | Status: AC
  Filled 2020-01-16: qty 10

## 2020-01-16 MED ORDER — FENTANYL CITRATE (PF) 100 MCG/2ML IJ SOLN
INTRAMUSCULAR | Status: AC
  Filled 2020-01-16: qty 2

## 2020-01-16 MED ORDER — FENTANYL CITRATE (PF) 100 MCG/2ML IJ SOLN
INTRAMUSCULAR | Status: DC | PRN
Start: 2020-01-16 — End: 2020-01-16
  Administered 2020-01-16 (×2): 50 ug via INTRAVENOUS

## 2020-01-16 MED ORDER — ONDANSETRON HCL 4 MG/2ML IJ SOLN
INTRAMUSCULAR | Status: DC | PRN
  Administered 2020-01-16: 4 mg via INTRAVENOUS

## 2020-01-16 SURGICAL SUPPLY — 70 items
ADH SKN CLS APL DERMABOND .7 (GAUZE/BANDAGES/DRESSINGS)
APL PRP STRL LF DISP 70% ISPRP (MISCELLANEOUS) ×1
APL SKNCLS STERI-STRIP NONHPOA (GAUZE/BANDAGES/DRESSINGS)
BAND INSRT 18 STRL LF DISP RB (MISCELLANEOUS)
BAND RUBBER #18 3X1/16 STRL (MISCELLANEOUS) IMPLANT
BENZOIN TINCTURE PRP APPL 2/3 (GAUZE/BANDAGES/DRESSINGS) IMPLANT
BLADE CLIPPER SURG (BLADE) IMPLANT
BLADE SURG 15 STRL LF DISP TIS (BLADE) ×1 IMPLANT
BLADE SURG 15 STRL SS (BLADE) ×3
CANISTER SUCT 1200ML W/VALVE (MISCELLANEOUS) IMPLANT
CHLORAPREP W/TINT 26 (MISCELLANEOUS) ×3 IMPLANT
CLOSURE WOUND 1/2 X4 (GAUZE/BANDAGES/DRESSINGS)
COVER BACK TABLE 60X90IN (DRAPES) ×3 IMPLANT
COVER MAYO STAND STRL (DRAPES) ×3 IMPLANT
COVER WAND RF STERILE (DRAPES) IMPLANT
DERMABOND ADVANCED (GAUZE/BANDAGES/DRESSINGS)
DERMABOND ADVANCED .7 DNX12 (GAUZE/BANDAGES/DRESSINGS) IMPLANT
DRAIN JP 10F RND SILICONE (MISCELLANEOUS) IMPLANT
DRAPE LAPAROTOMY 100X72 PEDS (DRAPES) IMPLANT
DRAPE U-SHAPE 76X120 STRL (DRAPES) IMPLANT
DRAPE UTILITY XL STRL (DRAPES) ×3 IMPLANT
DRSG TELFA 3X8 NADH (GAUZE/BANDAGES/DRESSINGS) IMPLANT
ELECT COATED BLADE 2.86 ST (ELECTRODE) IMPLANT
ELECT NDL BLADE 2-5/6 (NEEDLE) ×1 IMPLANT
ELECT NEEDLE BLADE 2-5/6 (NEEDLE) ×3 IMPLANT
ELECT REM PT RETURN 9FT ADLT (ELECTROSURGICAL) ×3
ELECT REM PT RETURN 9FT PED (ELECTROSURGICAL)
ELECTRODE REM PT RETRN 9FT PED (ELECTROSURGICAL) IMPLANT
ELECTRODE REM PT RTRN 9FT ADLT (ELECTROSURGICAL) IMPLANT
EVACUATOR SILICONE 100CC (DRAIN) IMPLANT
GAUZE SPONGE 4X4 12PLY STRL LF (GAUZE/BANDAGES/DRESSINGS) ×2 IMPLANT
GAUZE XEROFORM 1X8 LF (GAUZE/BANDAGES/DRESSINGS) IMPLANT
GLOVE BIOGEL M STRL SZ7.5 (GLOVE) ×5 IMPLANT
GLOVE BIOGEL PI IND STRL 8 (GLOVE) ×1 IMPLANT
GLOVE BIOGEL PI INDICATOR 8 (GLOVE)
GOWN STRL REUS W/ TWL LRG LVL3 (GOWN DISPOSABLE) ×2 IMPLANT
GOWN STRL REUS W/TWL LRG LVL3 (GOWN DISPOSABLE) ×9
NDL HYPO 30GX1 BEV (NEEDLE) IMPLANT
NDL PRECISIONGLIDE 27X1.5 (NEEDLE) ×1 IMPLANT
NEEDLE HYPO 30GX1 BEV (NEEDLE) IMPLANT
NEEDLE PRECISIONGLIDE 27X1.5 (NEEDLE) ×3 IMPLANT
NS IRRIG 1000ML POUR BTL (IV SOLUTION) IMPLANT
PACK BASIN DAY SURGERY FS (CUSTOM PROCEDURE TRAY) ×3 IMPLANT
PAD DRESSING TELFA 3X8 NADH (GAUZE/BANDAGES/DRESSINGS) IMPLANT
PENCIL SMOKE EVACUATOR (MISCELLANEOUS) ×3 IMPLANT
SHEET MEDIUM DRAPE 40X70 STRL (DRAPES) IMPLANT
SLEEVE SCD COMPRESS KNEE MED (MISCELLANEOUS) ×2 IMPLANT
SPONGE GAUZE 2X2 8PLY STER LF (GAUZE/BANDAGES/DRESSINGS)
SPONGE GAUZE 2X2 8PLY STRL LF (GAUZE/BANDAGES/DRESSINGS) IMPLANT
SPONGE LAP 18X18 RF (DISPOSABLE) IMPLANT
STAPLER VISISTAT 35W (STAPLE) ×3 IMPLANT
STRIP CLOSURE SKIN 1/2X4 (GAUZE/BANDAGES/DRESSINGS) IMPLANT
SUCTION FRAZIER HANDLE 10FR (MISCELLANEOUS)
SUCTION TUBE FRAZIER 10FR DISP (MISCELLANEOUS) IMPLANT
SUT ETHILON 4 0 PS 2 18 (SUTURE) IMPLANT
SUT MNCRL AB 4-0 PS2 18 (SUTURE) IMPLANT
SUT MON AB 5-0 P3 18 (SUTURE) ×2 IMPLANT
SUT PDS 3-0 CT2 (SUTURE)
SUT PDS II 3-0 CT2 27 ABS (SUTURE) IMPLANT
SUT PLAIN 5 0 P 3 18 (SUTURE) ×2 IMPLANT
SUT PROLENE 5 0 P 3 (SUTURE) IMPLANT
SUT VICRYL 4-0 PS2 18IN ABS (SUTURE) IMPLANT
SWAB COLLECTION DEVICE MRSA (MISCELLANEOUS) IMPLANT
SWAB CULTURE ESWAB REG 1ML (MISCELLANEOUS) IMPLANT
SYR BULB EAR ULCER 3OZ GRN STR (SYRINGE) IMPLANT
SYR CONTROL 10ML LL (SYRINGE) ×3 IMPLANT
TOWEL GREEN STERILE FF (TOWEL DISPOSABLE) ×3 IMPLANT
TRAY DSU PREP LF (CUSTOM PROCEDURE TRAY) IMPLANT
TUBE CONNECTING 20'X1/4 (TUBING)
TUBE CONNECTING 20X1/4 (TUBING) IMPLANT

## 2020-01-16 NOTE — Op Note (Signed)
Operative Note   DATE OF OPERATION: 01/16/2020  SURGICAL DEPARTMENT: Plastic Surgery  PREOPERATIVE DIAGNOSES:  Right cheek cyst  POSTOPERATIVE DIAGNOSES:  same  PROCEDURE:  1. Right cheek cyst excision 3.5 cm 2. Complex closure right cheek 3.5 cm  SURGEON: Ancil Linsey, MD  ASSISTANT: Zadie Cleverly, PA The advanced practice practitioner (APP) assisted throughout the case.  The APP was essential in retraction and counter traction when needed to make the case progress smoothly.  This retraction and assistance made it possible to see the tissue plans for the procedure.  The assistance was needed for blood control, tissue re-approximation and assisted with closure of the incision site.  ANESTHESIA:  General.   COMPLICATIONS: None.   INDICATIONS FOR PROCEDURE:  The patient, Jared Turner is a 39 y.o. male born on [birth year], is here for treatment of symptomatic right cheek cyst MRN: [id number]  CONSENT:  Informed consent was obtained directly from the patient. Risks, benefits and alternatives were fully discussed. Specific risks including but not limited to bleeding, infection, hematoma, seroma, scarring, pain, contracture, asymmetry, wound healing problems, and need for further surgery were all discussed. The patient did have an ample opportunity to have questions answered to satisfaction.   DESCRIPTION OF PROCEDURE:  The patient was taken to the operating room. SCDs were placed and antibiotics were given. General anesthesia was administered.  The patient's operative site was prepped and draped in a sterile fashion. A time out was performed and all information was confirmed to be correct.  Local anesthesia was administered.  An elliptical incision was made.  Cyst was dissected out with a 15 blade hugging the wall.  Hemostasis was obtained.  Surrounding skin was undermined and advanced and closed in layers with 5-0 monocryl buried sutures and 5-0 plain gut on the skin.  Soft  dressing was applied.  The patient tolerated the procedure well.  There were no complications. The patient was allowed to wake from anesthesia, extubated and taken to the recovery room in satisfactory condition.

## 2020-01-16 NOTE — Interval H&P Note (Signed)
Patient seen and examined. Risks and benefits discussed. Proceed with surgery.

## 2020-01-16 NOTE — Discharge Instructions (Addendum)
Activity As tolerated: NO showers until tomorrow AM. NO driving for 24 hours or while taking pain medication No heavy activities  Diet: Regular  Wound Care: Keep dressing clean & dry. Can remove after 24 hours.  Call Doctor if any unusual problems occur such as pain, excessive Bleeding, unrelieved Nausea/vomiting, Fever &/or chills  Follow up scheduled for 01/25/20 2:30 PM   Post Anesthesia Home Care Instructions  Activity: Get plenty of rest for the remainder of the day. A responsible individual must stay with you for 24 hours following the procedure.  For the next 24 hours, DO NOT: -Drive a car -Advertising copywriter -Drink alcoholic beverages -Take any medication unless instructed by your physician -Make any legal decisions or sign important papers.  Meals: Start with liquid foods such as gelatin or soup. Progress to regular foods as tolerated. Avoid greasy, spicy, heavy foods. If nausea and/or vomiting occur, drink only clear liquids until the nausea and/or vomiting subsides. Call your physician if vomiting continues.  Special Instructions/Symptoms: Your throat may feel dry or sore from the anesthesia or the breathing tube placed in your throat during surgery. If this causes discomfort, gargle with warm salt water. The discomfort should disappear within 24 hours.  If you had a scopolamine patch placed behind your ear for the management of post- operative nausea and/or vomiting:  1. The medication in the patch is effective for 72 hours, after which it should be removed.  Wrap patch in a tissue and discard in the trash. Wash hands thoroughly with soap and water. 2. You may remove the patch earlier than 72 hours if you experience unpleasant side effects which may include dry mouth, dizziness or visual disturbances. 3. Avoid touching the patch. Wash your hands with soap and water after contact with the patch.

## 2020-01-16 NOTE — Transfer of Care (Signed)
Immediate Anesthesia Transfer of Care Note  Patient: Jared Turner  Procedure(s) Performed: Excision of right cheek mass (Right )  Patient Location: PACU  Anesthesia Type:General  Level of Consciousness: drowsy  Airway & Oxygen Therapy: Patient Spontanous Breathing and Patient connected to face mask oxygen  Post-op Assessment: Report given to RN and Post -op Vital signs reviewed and stable  Post vital signs: Reviewed and stable  Last Vitals:  Vitals Value Taken Time  BP 126/70 01/16/20 0945  Temp    Pulse 70 01/16/20 0945  Resp 18 01/16/20 0945  SpO2 100 % 01/16/20 0945  Vitals shown include unvalidated device data.  Last Pain:  Vitals:   01/16/20 0706  TempSrc: Oral  PainSc: 0-No pain         Complications: No complications documented.

## 2020-01-16 NOTE — Anesthesia Procedure Notes (Signed)
Procedure Name: LMA Insertion Date/Time: 01/16/2020 8:59 AM Performed by: Ronnette Hila, CRNA Pre-anesthesia Checklist: Patient identified, Emergency Drugs available, Suction available and Patient being monitored Patient Re-evaluated:Patient Re-evaluated prior to induction Oxygen Delivery Method: Circle system utilized Preoxygenation: Pre-oxygenation with 100% oxygen Induction Type: IV induction Ventilation: Mask ventilation without difficulty LMA: LMA inserted LMA Size: 5.0 Number of attempts: 1 Airway Equipment and Method: Bite block Placement Confirmation: positive ETCO2 Tube secured with: Tape Dental Injury: Teeth and Oropharynx as per pre-operative assessment

## 2020-01-16 NOTE — Anesthesia Postprocedure Evaluation (Signed)
Anesthesia Post Note  Patient: Jared Turner  Procedure(s) Performed: Excision of right cheek mass (Right )     Patient location during evaluation: PACU Anesthesia Type: General Level of consciousness: awake Pain management: pain level controlled Vital Signs Assessment: post-procedure vital signs reviewed and stable Respiratory status: spontaneous breathing Cardiovascular status: stable Postop Assessment: no apparent nausea or vomiting Anesthetic complications: no   No complications documented.  Last Vitals:  Vitals:   01/16/20 0955 01/16/20 1000  BP:    Pulse: 72 71  Resp: 18 (!) 23  Temp:    SpO2: 100% 100%    Last Pain:  Vitals:   01/16/20 1000  TempSrc:   PainSc: 0-No pain                 Ilijah Doucet

## 2020-01-16 NOTE — Brief Op Note (Signed)
01/16/2020  9:29 AM  PATIENT:  Jared Turner  39 y.o. male  PRE-OPERATIVE DIAGNOSIS:  right cheek mass  POST-OPERATIVE DIAGNOSIS:  right cheek mass  PROCEDURE:  Procedure(s): Excision of right cheek mass (Right)  SURGEON:  Surgeon(s) and Role:    * Mishel Sans, Wendy Poet, MD - Primary  PHYSICIAN ASSISTANT: Materials engineer, PA  ASSISTANTS: none   ANESTHESIA:   general  EBL:  5   BLOOD ADMINISTERED:none  DRAINS: none   LOCAL MEDICATIONS USED:  LIDOCAINE   SPECIMEN:  Source of Specimen:  right cheek cyst  DISPOSITION OF SPECIMEN:  PATHOLOGY  COUNTS:  YES  TOURNIQUET:  * No tourniquets in log *  DICTATION: .Dragon Dictation  PLAN OF CARE: Discharge to home after PACU  PATIENT DISPOSITION:  PACU - hemodynamically stable.   Delay start of Pharmacological VTE agent (>24hrs) due to surgical blood loss or risk of bleeding: not applicable

## 2020-01-16 NOTE — Anesthesia Preprocedure Evaluation (Addendum)
Anesthesia Evaluation  Patient identified by MRN, date of birth, ID band  Reviewed: Allergy & Precautions, NPO status , Patient's Chart, lab work & pertinent test results  Airway Mallampati: II  TM Distance: >3 FB     Dental   Pulmonary asthma , Current Smoker and Patient abstained from smoking.,    breath sounds clear to auscultation       Cardiovascular hypertension,  Rhythm:Regular Rate:Normal     Neuro/Psych    GI/Hepatic negative GI ROS, Neg liver ROS,   Endo/Other    Renal/GU Renal disease     Musculoskeletal   Abdominal   Peds  Hematology   Anesthesia Other Findings   Reproductive/Obstetrics                             Anesthesia Physical Anesthesia Plan  ASA: III  Anesthesia Plan: General   Post-op Pain Management:    Induction: Intravenous  PONV Risk Score and Plan: 2 and Ondansetron, Dexamethasone and Midazolam  Airway Management Planned: LMA  Additional Equipment:   Intra-op Plan:   Post-operative Plan: Extubation in OR  Informed Consent: I have reviewed the patients History and Physical, chart, labs and discussed the procedure including the risks, benefits and alternatives for the proposed anesthesia with the patient or authorized representative who has indicated his/her understanding and acceptance.       Plan Discussed with: CRNA and Anesthesiologist  Anesthesia Plan Comments:        Anesthesia Quick Evaluation

## 2020-01-17 LAB — SURGICAL PATHOLOGY

## 2020-01-18 ENCOUNTER — Encounter (HOSPITAL_BASED_OUTPATIENT_CLINIC_OR_DEPARTMENT_OTHER): Payer: Self-pay | Admitting: Plastic Surgery

## 2020-01-25 ENCOUNTER — Ambulatory Visit (INDEPENDENT_AMBULATORY_CARE_PROVIDER_SITE_OTHER): Payer: 59 | Admitting: Surgical

## 2020-01-25 ENCOUNTER — Encounter: Payer: Self-pay | Admitting: Plastic Surgery

## 2020-01-25 ENCOUNTER — Other Ambulatory Visit: Payer: Self-pay

## 2020-01-25 VITALS — BP 146/101 | HR 71 | Temp 98.0°F

## 2020-01-25 DIAGNOSIS — R220 Localized swelling, mass and lump, head: Secondary | ICD-10-CM

## 2020-01-25 NOTE — Progress Notes (Signed)
Patient is a 39 year old male here for follow-up after excision of right cheek mass on 01/16/2020 with Dr. Arita Miss.  He reports that he is doing well.  He has not had any infectious symptoms.  Pathology report showed epidermal inclusion cyst. Reports that he is returning to work soon.  Blood pressure today was elevated 146/101, he reports that he has not taken his blood pressure medication since surgery.  He also reports that he does not take it while working.  He reports no symptoms today of concern.   On exam right cheek incision is intact, running sutures in place.  Some of them have dissolved.  No dehiscence noted.  No erythema noted.  No swelling noted.  No foul odors or drainage noted. No facial nerve pattern deficits or concern noted.  I removed the plain gut sutures, patient tolerated this fine.  Steri-Strip was placed over the incision.  Recommend removing this in approximately 1 week or sooner if it begins to fall off.  Recommend calling with any questions or concerns, follow-up as needed.  There is no sign of any infection.  Discussed pathology report with patient and provided with copy

## 2023-04-26 ENCOUNTER — Encounter (HOSPITAL_BASED_OUTPATIENT_CLINIC_OR_DEPARTMENT_OTHER): Payer: Self-pay | Admitting: Emergency Medicine

## 2023-04-26 DIAGNOSIS — K611 Rectal abscess: Secondary | ICD-10-CM | POA: Insufficient documentation

## 2023-04-26 DIAGNOSIS — Z5321 Procedure and treatment not carried out due to patient leaving prior to being seen by health care provider: Secondary | ICD-10-CM | POA: Insufficient documentation

## 2023-04-26 NOTE — ED Triage Notes (Signed)
 Abscess "lump" near rectum  left side notice on 04/17/23 getting worse. Pain Unable to sit

## 2023-04-27 ENCOUNTER — Emergency Department (HOSPITAL_BASED_OUTPATIENT_CLINIC_OR_DEPARTMENT_OTHER)
Admission: EM | Admit: 2023-04-27 | Discharge: 2023-04-27 | Discharge status: Against Medical Advice | Payer: Self-pay | Attending: Emergency Medicine | Admitting: Emergency Medicine

## 2023-11-16 ENCOUNTER — Ambulatory Visit: Payer: Self-pay | Admitting: Family

## 2023-11-16 ENCOUNTER — Ambulatory Visit (INDEPENDENT_AMBULATORY_CARE_PROVIDER_SITE_OTHER): Payer: Self-pay | Admitting: Family

## 2023-11-16 VITALS — BP 235/135 | HR 62 | Temp 98.0°F | Resp 16 | Ht 72.0 in | Wt 287.8 lb

## 2023-11-16 DIAGNOSIS — F418 Other specified anxiety disorders: Secondary | ICD-10-CM

## 2023-11-16 DIAGNOSIS — R399 Unspecified symptoms and signs involving the genitourinary system: Secondary | ICD-10-CM | POA: Diagnosis not present

## 2023-11-16 DIAGNOSIS — G4700 Insomnia, unspecified: Secondary | ICD-10-CM

## 2023-11-16 DIAGNOSIS — L709 Acne, unspecified: Secondary | ICD-10-CM | POA: Diagnosis not present

## 2023-11-16 DIAGNOSIS — I10 Essential (primary) hypertension: Secondary | ICD-10-CM | POA: Diagnosis not present

## 2023-11-16 DIAGNOSIS — Z23 Encounter for immunization: Secondary | ICD-10-CM | POA: Diagnosis not present

## 2023-11-16 DIAGNOSIS — F419 Anxiety disorder, unspecified: Secondary | ICD-10-CM

## 2023-11-16 DIAGNOSIS — Z13228 Encounter for screening for other metabolic disorders: Secondary | ICD-10-CM

## 2023-11-16 DIAGNOSIS — Z7689 Persons encountering health services in other specified circumstances: Secondary | ICD-10-CM

## 2023-11-16 LAB — POCT URINALYSIS DIP (CLINITEK)
Bilirubin, UA: NEGATIVE
Glucose, UA: NEGATIVE mg/dL
Ketones, POC UA: NEGATIVE mg/dL
Leukocytes, UA: NEGATIVE
Nitrite, UA: NEGATIVE
POC PROTEIN,UA: NEGATIVE
Spec Grav, UA: 1.015 (ref 1.010–1.025)
Urobilinogen, UA: 0.2 U/dL
pH, UA: 5.5 (ref 5.0–8.0)

## 2023-11-16 MED ORDER — VALSARTAN 40 MG PO TABS: 40.0000 mg | ORAL_TABLET | Freq: Every day | ORAL | 0 refills | Status: DC

## 2023-11-16 MED ORDER — HYDROCHLOROTHIAZIDE 50 MG PO TABS: 50.0000 mg | ORAL_TABLET | Freq: Every day | ORAL | 0 refills | Status: DC

## 2023-11-16 NOTE — Progress Notes (Signed)
 Subjective:    Jared Turner - 43 y.o. male MRN [id number]  Date of birth: 08-03-1980  HPI  Jared Turner is to establish care.   Current issues and/or concerns: - High blood pressure. States he has not taken Hydrochlorothiazide  in 7 months due to needing refills. He limits salt intake. He does not exercise outside of normal routine. He does not complain of red flag symptoms such as but not limited to chest pain, shortness of breath, worst headache of life, nausea/vomiting.  - Anxiety depression. States his job at [employer] contributes to anxiety depression. He denies thoughts of self-harm, suicidal ideations, homicidal ideations. - Reports issues sleeping at nighttime. - Facial acne causing hair loss (beard) and rash (of face). States symptoms persisting for years. Denies red flag symptoms.  - States thinks he has a kidney stone. States he had a kidney stone years ago. States he was recently seen by Orthopedics who also thinks he has a kidney stone but they did not do anything about it.   ROS per HPI     Health Maintenance:  Health Maintenance Due  Topic Date Due   HIV Screening  Never done   Hepatitis C Screening  Never done     Past Medical History: Patient Active Problem List   Diagnosis Date Noted   Essential hypertension 07/27/2019      Social History   reports that he has been smoking cigarettes. He has never used smokeless tobacco. He reports current alcohol use. He reports that he does not use drugs.   Family History  family history is not on file.   Medications: reviewed and updated   Objective:   Physical Exam BP (!) 235/135   Pulse 62   Temp 98 F (36.7 C) (Oral)   Resp 16   Ht 6' (1.829 m)   Wt 287 lb 12.8 oz (130.5 kg)   SpO2 98%   BMI 39.03 kg/m      11/16/2023   10:19 AM 11/16/2023    9:57 AM 04/26/2023   10:05 PM  Vitals with BMI  Height  6' 0   Weight  287 lbs 13 oz   BMI  39.02   Systolic 235 199 778  Diastolic 135 119 860   Pulse  62 90    Physical Exam HENT:     Head: Normocephalic and atraumatic.     Nose: Nose normal.     Mouth/Throat:     Mouth: Mucous membranes are moist.     Pharynx: Oropharynx is clear.  Eyes:     Extraocular Movements: Extraocular movements intact.     Conjunctiva/sclera: Conjunctivae normal.     Pupils: Pupils are equal, round, and reactive to light.  Cardiovascular:     Rate and Rhythm: Normal rate and regular rhythm.     Pulses: Normal pulses.     Heart sounds: Normal heart sounds.  Pulmonary:     Effort: Pulmonary effort is normal.     Breath sounds: Normal breath sounds.  Musculoskeletal:        General: Normal range of motion.     Cervical back: Normal range of motion and neck supple.  Skin:    General: Skin is warm and dry.     Findings: Acne present.     Comments: Hair loss of face (beard), no drainage.  Neurological:     General: No focal deficit present.     Mental Status: He is alert and oriented to person, place, and time.  Psychiatric:        Mood and Affect: Mood normal.        Behavior: Behavior normal.       Assessment & Plan:  1. Encounter to establish care (Primary) - Patient presents today to establish care. During the interim follow-up with primary provider as scheduled.  - Return for annual physical examination, labs, and health maintenance. Arrive fasting meaning having no food for at least 8 hours prior to appointment. You may have only water or black coffee. Please take scheduled medications as normal.  2. Uncontrolled hypertension - Blood pressure not at goal during today's visit. Patient asymptomatic without chest pressure, chest pain, palpitations, shortness of breath, worst headache of life, and any additional red flag symptoms. - Patient declined Hydralazine to be administered in office.  - Patient singed AMA form.  - Resume Hydrochlorothiazide  as prescribed.  - Trial Valsartan  as prescribed.  - Routine screening.  - Counseled on  blood pressure goal of less than 130/80, low-sodium, DASH diet, medication compliance, and 150 minutes of moderate intensity exercise per week as tolerated. Counseled on medication adherence and adverse effects. - Referral to Cardiology for evaluation/management. - Follow-up with primary provider as scheduled.  - hydrochlorothiazide  (HYDRODIURIL ) 50 MG tablet; Take 1 tablet (50 mg total) by mouth daily.  Dispense: 90 tablet; Refill: 0 - valsartan  (DIOVAN ) 40 MG tablet; Take 1 tablet (40 mg total) by mouth daily.  Dispense: 90 tablet; Refill: 0 - Ambulatory referral to Cardiology - Basic Metabolic Panel  3. Anxiety and depression - Patient denies thoughts of self-harm, suicidal ideations, homicidal ideations. - Patient declined pharmacological therapy.  - Patient declined referral to Psychiatry.  - Follow-up with primary provider as scheduled.  4. Insomnia, unspecified type - Patient declined pharmacological therapy.  - Follow-up with primary provider as scheduled.  5. Acne, unspecified acne type - Referral to Dermatology for evaluation/management. - Ambulatory referral to Dermatology  6. Lower urinary tract symptoms (LUTS) - Routine screening.  - US  Renal for evaluation. - US  Renal; Future - POCT URINALYSIS DIP (CLINITEK); Future - POCT URINALYSIS DIP (CLINITEK)  7. Immunization due - Administered.  - Tdap vaccine greater than or equal to 7yo IM    Patient was given clear instructions to go to Emergency Department or return to medical center if symptoms don't improve, worsen, or new problems develop.The patient verbalized understanding.  I discussed the assessment and treatment plan with the patient. The patient was provided an opportunity to ask questions and all were answered. The patient agreed with the plan and demonstrated an understanding of the instructions.   The patient was advised to call back or seek an in-person evaluation if the symptoms worsen or if the condition  fails to improve as anticipated.    Greig Drones, NP 11/16/2023, 11:19 AM Primary Care at [facility]

## 2023-11-16 NOTE — Progress Notes (Signed)
 Was here two years ago about his face and now its happen again, hair fallen out, rash, can't sleep at night because he is up itching, kidney stone, patient scored a 13 on GAD-7

## 2023-11-17 LAB — BASIC METABOLIC PANEL WITH GFR
BUN/Creatinine Ratio: 12 (ref 9–20)
BUN: 19 mg/dL (ref 6–24)
CO2: 23 mmol/L (ref 20–29)
Calcium: 9.6 mg/dL (ref 8.7–10.2)
Chloride: 104 mmol/L (ref 96–106)
Creatinine, Ser: 1.54 mg/dL — ABNORMAL HIGH (ref 0.76–1.27)
Glucose: 101 mg/dL — ABNORMAL HIGH (ref 70–99)
Potassium: 4.9 mmol/L (ref 3.5–5.2)
Sodium: 142 mmol/L (ref 134–144)
eGFR: 57 mL/min/1.73 — ABNORMAL LOW (ref >59)

## 2023-12-06 ENCOUNTER — Ambulatory Visit (INDEPENDENT_AMBULATORY_CARE_PROVIDER_SITE_OTHER): Payer: Self-pay

## 2023-12-06 VITALS — BP 154/108 | HR 66 | Temp 98.1°F | Resp 16 | Wt 287.0 lb

## 2023-12-06 DIAGNOSIS — I10 Essential (primary) hypertension: Secondary | ICD-10-CM

## 2023-12-06 DIAGNOSIS — L709 Acne, unspecified: Secondary | ICD-10-CM | POA: Diagnosis not present

## 2023-12-06 NOTE — Progress Notes (Signed)
 Patient ID: Jared Turner, male    DOB: [birth year]  MRN: [id number]  CC: Medical Management of Chronic Issues (Patient is here for 2wk follow-up BP/Patent has uncontrolled hypertension/Provider is  aware of readings )   Subjective: Jared Turner is a 43 y.o. male with past medical history of hypertension, who presents to clinic for follow up on elevated blood pressure on last visit. Pt reports he is taking hydrochlorothiazide  and valsartan  as indicated. Denies headaches, vision changes, chest pain, shortness of breath or lower extremity swelling.  Reports he has not been contacted by Dermatology to schedule appointment for acne.    Patient Active Problem List   Diagnosis Date Noted   Essential hypertension 07/27/2019     Current Outpatient Medications on File Prior to Visit  Medication Sig Dispense Refill   hydrochlorothiazide  (HYDRODIURIL ) 50 MG tablet Take 1 tablet (50 mg total) by mouth daily. 90 tablet 0   valsartan  (DIOVAN ) 40 MG tablet Take 1 tablet (40 mg total) by mouth daily. 90 tablet 0   No current facility-administered medications on file prior to visit.    Allergies  Allergen Reactions   Aspirin Other (See Comments)    Chest tightness   Milk-Related Compounds Other (See Comments)    Bloating and shortness of breath   Pseudoephedrine-Acetaminophen  Other (See Comments)    Chest tightness Chest tightness    Tylenol  [Acetaminophen ]      No family history on file.  Past Surgical History:  Procedure Laterality Date   ADENOIDECTOMY     ARTERY REPAIR Right arm   MASS EXCISION Right 01/16/2020   Procedure: Excision of right cheek mass;  Surgeon: Elisabeth Craig RAMAN, MD;  Location: [facility];  Service: Plastics;  Laterality: Right;    ROS: Review of Systems Negative except as stated above  PHYSICAL EXAM: BP (!) 154/108   Pulse 66   Temp 98.1 F (36.7 C) (Oral)   Resp 16   Wt 287 lb (130.2 kg)   SpO2 96%   BMI 38.92 kg/m   Physical  Exam  General: well-appearing, no acute distress Skin: Comedones noted along jaw line bilaterally Cardiovascular: regular heart rate and rhythm, normal S1/S2, no murmurs, gallops, or rubs, peripheral pulses 2+ bilaterally Chest: no skeletal deformity, lungs clear to auscultation bilaterally, equal breath sounds bilaterally Musculoskeletal:normal gait Extremities: no peripheral edema   ASSESSMENT AND PLAN:  1. Essential hypertension (Primary) - BP today is 154/108 which is an improvement from previous visit. Previous visit BP was 235/135. - Encouraged to continue medication regimen and adhering to low sodium diet.  - Plan to follow up with PCP in 2 months. Consider BMP to assess electrolytes given diuretic use.  - Encouraged to drink plenty of water to avoid dehydration from diuretic.    2. Acne, unspecified acne type - Comedones noted along jaw line bilaterally. Picture under Media tab.  - Discussed face wash and lotion application. - Encouraged to follow up with Dermatology for medication management.   - Ambulatory referral to Dermatology    Patient was given the opportunity to ask questions.  Patient verbalized understanding of the plan and was able to repeat key elements of the plan. Patient was given clear instructions to go to Emergency Department or return to medical center if symptoms don't improve, worsen, or new problems develop.The patient verbalized understanding.   Orders Placed This Encounter  Procedures   Ambulatory referral to Dermatology     Requested Prescriptions    No prescriptions requested  or ordered in this encounter    Return in about 2 months (around 02/16/2024) for follow-up.  Sula Leavy Rode, PA-C

## 2023-12-07 ENCOUNTER — Ambulatory Visit
Admission: RE | Admit: 2023-12-07 | Discharge: 2023-12-07 | Disposition: A | Discharge status: Home / Self Care | Source: Ambulatory Visit | Attending: Family | Admitting: Family

## 2023-12-07 DIAGNOSIS — R399 Unspecified symptoms and signs involving the genitourinary system: Secondary | ICD-10-CM

## 2023-12-22 ENCOUNTER — Telehealth: Payer: Self-pay | Admitting: Family

## 2023-12-22 NOTE — Telephone Encounter (Signed)
 US  Renal ordered during office visit on 11/16/2023.

## 2023-12-24 NOTE — Telephone Encounter (Signed)
 Called pt. No answer, LVM to call back.

## 2023-12-28 NOTE — Telephone Encounter (Signed)
 Copied from CRM [id number]. Topic: Clinical - Lab/Test Results >> Dec 27, 2023 10:26 AM [place] E wrote: Reason for CRM: Patient was returning a call for ultrasound results, call back number [phone].   I returned patient call no one answered so I left a voicemail to return my call.

## 2024-02-17 ENCOUNTER — Ambulatory Visit: Admitting: Family

## 2024-02-17 ENCOUNTER — Encounter: Payer: Self-pay | Admitting: Family

## 2024-02-17 VITALS — BP 158/117 | HR 67 | Temp 98.3°F | Resp 16 | Ht 72.0 in | Wt 291.0 lb

## 2024-02-17 DIAGNOSIS — I10 Essential (primary) hypertension: Secondary | ICD-10-CM | POA: Diagnosis not present

## 2024-02-17 MED ORDER — HYDROCHLOROTHIAZIDE 50 MG PO TABS: 50.0000 mg | ORAL_TABLET | Freq: Every day | ORAL | 0 refills | Status: DC

## 2024-02-17 MED ORDER — VALSARTAN 80 MG PO TABS: 80.0000 mg | ORAL_TABLET | Freq: Every day | ORAL | 0 refills | Status: AC

## 2024-02-17 NOTE — Progress Notes (Signed)
 Patient ID: Jared Turner, male    DOB: February 15, 1981  MRN: [id number]  CC: Chronic Conditions Follow-Up  Subjective: Jared Turner is a 43 y.o. male who presents for chronic conditions follow-up.  His concerns today include:  Doing well on Valsartan  and Hydrochlorothiazide , no issues/concerns. He does not limit salt intake. He does not exercise outside of normal routine. He does not check blood pressure outside of office. He does not complain of red flag symptoms such as but not limited to chest pain, shortness of breath, worst headache of life, nausea/vomiting.   Patient Active Problem List   Diagnosis Date Noted   Acne 12/06/2023   Essential hypertension 07/27/2019     No current outpatient medications on file prior to visit.   No current facility-administered medications on file prior to visit.    Allergies  Allergen Reactions   Aspirin Other (See Comments)    Chest tightness   Milk-Related Compounds Other (See Comments)    Bloating and shortness of breath   Pseudoephedrine-Acetaminophen  Other (See Comments)    Chest tightness Chest tightness    Tylenol  [Acetaminophen ]     Social History   Socioeconomic History   Marital status: Single    Spouse name: Not on file   Number of children: Not on file   Years of education: Not on file   Highest education level: Some college, no degree  Occupational History   Not on file  Tobacco Use   Smoking status: Every Day    Current packs/day: 0.50    Types: Cigarettes   Smokeless tobacco: Never  Vaping Use   Vaping status: Never Used  Substance and Sexual Activity   Alcohol use: Yes    Comment: rarely   Drug use: No    Frequency: 3.0 times per week   Sexual activity: Yes    Birth control/protection: Condom  Other Topics Concern   Not on file  Social History Narrative   Not on file   Social Drivers of Health   Financial Resource Strain: Medium Risk (11/16/2023)   Overall Financial Resource Strain (CARDIA)     Difficulty of Paying Living Expenses: Somewhat hard  Food Insecurity: Food Insecurity Present (02/17/2024)   Hunger Vital Sign    Worried About Running Out of Food in the Last Year: Sometimes true    Ran Out of Food in the Last Year: Sometimes true  Transportation Needs: Unmet Transportation Needs (02/17/2024)   PRAPARE - Transportation    Lack of Transportation (Medical): No    Lack of Transportation (Non-Medical): Yes  Physical Activity: Sufficiently Active (11/16/2023)   Exercise Vital Sign    Days of Exercise per Week: 4 days    Minutes of Exercise per Session: 150+ min  Stress: Stress Concern Present (11/16/2023)   [employer] of Occupational [employer] - Occupational Stress Questionnaire    Feeling of Stress: To some extent  Social Connections: Socially Isolated (11/16/2023)   Social Connection and Isolation Panel    Frequency of Communication with Friends and Family: Three times a week    Frequency of Social Gatherings with Friends and Family: Twice a week    Attends Religious Services: Never    Database Administrator or Organizations: No    Attends Engineer, Structural: Not on file    Marital Status: Never married  Intimate Partner Violence: Not At Risk (11/16/2023)   Humiliation, Afraid, Rape, and Kick questionnaire    Fear of Current or Ex-Partner: No  Emotionally Abused: No    Physically Abused: No    Sexually Abused: No    History reviewed. No pertinent family history.  Past Surgical History:  Procedure Laterality Date   ADENOIDECTOMY     ARTERY REPAIR Right arm   MASS EXCISION Right 01/16/2020   Procedure: Excision of right cheek mass;  Surgeon: Elisabeth Craig RAMAN, MD;  Location: [facility];  Service: Plastics;  Laterality: Right;    ROS: Review of Systems Negative except as stated above  PHYSICAL EXAM: BP (!) 158/117   Pulse 67   Temp 98.3 F (36.8 C) (Oral)   Resp 16   Ht 6' (1.829 m)   Wt 291 lb (132 kg)   SpO2 94%   BMI  39.47 kg/m   Physical Exam HENT:     Head: Normocephalic and atraumatic.     Nose: Nose normal.     Mouth/Throat:     Mouth: Mucous membranes are moist.     Pharynx: Oropharynx is clear.  Eyes:     Extraocular Movements: Extraocular movements intact.     Conjunctiva/sclera: Conjunctivae normal.     Pupils: Pupils are equal, round, and reactive to light.  Cardiovascular:     Rate and Rhythm: Normal rate and regular rhythm.     Pulses: Normal pulses.     Heart sounds: Normal heart sounds.  Pulmonary:     Effort: Pulmonary effort is normal.     Breath sounds: Normal breath sounds.  Musculoskeletal:        General: Normal range of motion.     Cervical back: Normal range of motion and neck supple.  Neurological:     General: No focal deficit present.     Mental Status: He is alert and oriented to person, place, and time.  Psychiatric:        Mood and Affect: Mood normal.        Behavior: Behavior normal.     ASSESSMENT AND PLAN: 1. Primary hypertension (Primary) - Blood pressure not at goal during today's visit. Patient asymptomatic without chest pressure, chest pain, palpitations, shortness of breath, worst headache of life, and any additional red flag symptoms. - Increase Valsartan  from 40 mg to 80 mg as prescribed.  - Continue Hydrochlorothiazide  as prescribed. - Routine screening.  - Counseled on blood pressure goal of less than 130/80, low-sodium, DASH diet, medication compliance, and 150 minutes of moderate intensity exercise per week as tolerated. Counseled on medication adherence and adverse effects. - Follow-up with primary provider in 4 weeks or sooner if needed. - hydrochlorothiazide  (HYDRODIURIL ) 50 MG tablet; Take 1 tablet (50 mg total) by mouth daily.  Dispense: 90 tablet; Refill: 0 - valsartan  (DIOVAN ) 80 MG tablet; Take 1 tablet (80 mg total) by mouth daily.  Dispense: 90 tablet; Refill: 0 - Basic Metabolic Panel   Patient was given the opportunity to ask  questions.  Patient verbalized understanding of the plan and was able to repeat key elements of the plan. Patient was given clear instructions to go to Emergency Department or return to medical center if symptoms don't improve, worsen, or new problems develop.The patient verbalized understanding.   Orders Placed This Encounter  Procedures   Basic Metabolic Panel     Requested Prescriptions   Signed Prescriptions Disp Refills   hydrochlorothiazide  (HYDRODIURIL ) 50 MG tablet 90 tablet 0    Sig: Take 1 tablet (50 mg total) by mouth daily.   valsartan  (DIOVAN ) 80 MG tablet 90 tablet 0  Sig: Take 1 tablet (80 mg total) by mouth daily.    Return in about 4 weeks (around 03/16/2024) for Follow-Up or next available chronic conditions.  Greig JINNY Drones, NP

## 2024-02-18 ENCOUNTER — Ambulatory Visit: Payer: Self-pay | Admitting: Family

## 2024-02-18 DIAGNOSIS — N1831 Chronic kidney disease, stage 3a: Secondary | ICD-10-CM

## 2024-02-18 LAB — BASIC METABOLIC PANEL WITH GFR
BUN/Creatinine Ratio: 14 (ref 9–20)
BUN: 23 mg/dL (ref 6–24)
CO2: 24 mmol/L (ref 20–29)
Calcium: 10.1 mg/dL (ref 8.7–10.2)
Chloride: 102 mmol/L (ref 96–106)
Creatinine, Ser: 1.69 mg/dL — ABNORMAL HIGH (ref 0.76–1.27)
Glucose: 95 mg/dL (ref 70–99)
Potassium: 4.8 mmol/L (ref 3.5–5.2)
Sodium: 140 mmol/L (ref 134–144)
eGFR: 51 mL/min/1.73 — ABNORMAL LOW (ref >59)

## 2024-03-21 ENCOUNTER — Other Ambulatory Visit: Payer: Self-pay | Admitting: Family

## 2024-03-21 DIAGNOSIS — I10 Essential (primary) hypertension: Secondary | ICD-10-CM

## 2024-03-21 NOTE — Telephone Encounter (Signed)
 Complete

## 2024-09-12 ENCOUNTER — Ambulatory Visit: Admitting: Physician Assistant
